# Patient Record
Sex: Male | Born: 1945 | Race: White | Hispanic: No | Marital: Married | Smoking: Never smoker
Health system: Southern US, Community
[De-identification: ages and names within clinical notes are randomized; demographics above are authoritative.]

## PROBLEM LIST (undated history)

## (undated) DIAGNOSIS — C61 Malignant neoplasm of prostate: Secondary | ICD-10-CM

## (undated) DIAGNOSIS — I10 Essential (primary) hypertension: Secondary | ICD-10-CM

## (undated) DIAGNOSIS — E039 Hypothyroidism, unspecified: Secondary | ICD-10-CM

## (undated) DIAGNOSIS — M1990 Unspecified osteoarthritis, unspecified site: Secondary | ICD-10-CM

## (undated) HISTORY — PX: HERNIA REPAIR: SHX51

## (undated) HISTORY — PX: PROSTATE BIOPSY: SHX241

## (undated) HISTORY — PX: THYROID SURGERY: SHX805

## (undated) HISTORY — PX: CARPAL TUNNEL RELEASE: SHX101

---

## 1976-01-13 HISTORY — PX: ANTERIOR CRUCIATE LIGAMENT (ACL) REVISION: SHX6707

## 1979-01-13 HISTORY — PX: ANTERIOR CRUCIATE LIGAMENT (ACL) REVISION: SHX6707

## 1985-01-12 HISTORY — PX: HYDROCELE EXCISION: SHX482

## 1985-01-12 HISTORY — PX: PARATHYROIDECTOMY: SHX19

## 2000-01-13 HISTORY — PX: VENTRAL HERNIA REPAIR: SHX424

## 2006-01-12 HISTORY — PX: CATARACT EXTRACTION, BILATERAL: SHX1313

## 2007-01-13 HISTORY — PX: TOTAL KNEE ARTHROPLASTY: SHX125

## 2012-01-13 HISTORY — PX: COLONOSCOPY: SHX174

## 2012-12-13 DIAGNOSIS — I10 Essential (primary) hypertension: Secondary | ICD-10-CM | POA: Diagnosis not present

## 2012-12-13 DIAGNOSIS — E785 Hyperlipidemia, unspecified: Secondary | ICD-10-CM | POA: Diagnosis not present

## 2012-12-13 DIAGNOSIS — R7301 Impaired fasting glucose: Secondary | ICD-10-CM | POA: Diagnosis not present

## 2012-12-13 DIAGNOSIS — Z6827 Body mass index (BMI) 27.0-27.9, adult: Secondary | ICD-10-CM | POA: Diagnosis not present

## 2012-12-13 DIAGNOSIS — C61 Malignant neoplasm of prostate: Secondary | ICD-10-CM | POA: Diagnosis not present

## 2013-01-18 DIAGNOSIS — H04129 Dry eye syndrome of unspecified lacrimal gland: Secondary | ICD-10-CM | POA: Diagnosis not present

## 2013-05-02 DIAGNOSIS — Z1389 Encounter for screening for other disorder: Secondary | ICD-10-CM | POA: Diagnosis not present

## 2013-05-02 DIAGNOSIS — C61 Malignant neoplasm of prostate: Secondary | ICD-10-CM | POA: Diagnosis not present

## 2013-11-03 DIAGNOSIS — Z23 Encounter for immunization: Secondary | ICD-10-CM | POA: Diagnosis not present

## 2013-11-06 DIAGNOSIS — C61 Malignant neoplasm of prostate: Secondary | ICD-10-CM | POA: Diagnosis not present

## 2013-11-06 DIAGNOSIS — Z8546 Personal history of malignant neoplasm of prostate: Secondary | ICD-10-CM | POA: Diagnosis not present

## 2013-11-06 DIAGNOSIS — Z08 Encounter for follow-up examination after completed treatment for malignant neoplasm: Secondary | ICD-10-CM | POA: Diagnosis not present

## 2013-11-23 DIAGNOSIS — E780 Pure hypercholesterolemia: Secondary | ICD-10-CM | POA: Diagnosis not present

## 2013-11-23 DIAGNOSIS — Z79899 Other long term (current) drug therapy: Secondary | ICD-10-CM | POA: Diagnosis not present

## 2013-11-23 DIAGNOSIS — Z23 Encounter for immunization: Secondary | ICD-10-CM | POA: Diagnosis not present

## 2013-11-23 DIAGNOSIS — R0609 Other forms of dyspnea: Secondary | ICD-10-CM | POA: Diagnosis not present

## 2013-11-23 DIAGNOSIS — Z800 Family history of malignant neoplasm of digestive organs: Secondary | ICD-10-CM | POA: Diagnosis not present

## 2013-11-23 DIAGNOSIS — I10 Essential (primary) hypertension: Secondary | ICD-10-CM | POA: Diagnosis not present

## 2013-11-23 DIAGNOSIS — C61 Malignant neoplasm of prostate: Secondary | ICD-10-CM | POA: Diagnosis not present

## 2013-12-19 DIAGNOSIS — E780 Pure hypercholesterolemia: Secondary | ICD-10-CM | POA: Diagnosis not present

## 2013-12-19 DIAGNOSIS — Z79899 Other long term (current) drug therapy: Secondary | ICD-10-CM | POA: Diagnosis not present

## 2013-12-19 DIAGNOSIS — I10 Essential (primary) hypertension: Secondary | ICD-10-CM | POA: Diagnosis not present

## 2013-12-29 DIAGNOSIS — R739 Hyperglycemia, unspecified: Secondary | ICD-10-CM | POA: Diagnosis not present

## 2014-05-08 DIAGNOSIS — Z08 Encounter for follow-up examination after completed treatment for malignant neoplasm: Secondary | ICD-10-CM | POA: Diagnosis not present

## 2014-05-08 DIAGNOSIS — Z8546 Personal history of malignant neoplasm of prostate: Secondary | ICD-10-CM | POA: Diagnosis not present

## 2014-05-08 DIAGNOSIS — C61 Malignant neoplasm of prostate: Secondary | ICD-10-CM | POA: Diagnosis not present

## 2014-07-18 DIAGNOSIS — Z79899 Other long term (current) drug therapy: Secondary | ICD-10-CM | POA: Diagnosis not present

## 2014-07-18 DIAGNOSIS — E780 Pure hypercholesterolemia: Secondary | ICD-10-CM | POA: Diagnosis not present

## 2014-07-18 DIAGNOSIS — E039 Hypothyroidism, unspecified: Secondary | ICD-10-CM | POA: Diagnosis not present

## 2014-07-18 DIAGNOSIS — I10 Essential (primary) hypertension: Secondary | ICD-10-CM | POA: Diagnosis not present

## 2014-07-18 DIAGNOSIS — Z0000 Encounter for general adult medical examination without abnormal findings: Secondary | ICD-10-CM | POA: Diagnosis not present

## 2014-07-18 DIAGNOSIS — R7309 Other abnormal glucose: Secondary | ICD-10-CM | POA: Diagnosis not present

## 2014-07-18 DIAGNOSIS — C61 Malignant neoplasm of prostate: Secondary | ICD-10-CM | POA: Diagnosis not present

## 2014-08-31 DIAGNOSIS — Z1211 Encounter for screening for malignant neoplasm of colon: Secondary | ICD-10-CM | POA: Diagnosis not present

## 2014-10-14 DIAGNOSIS — Z23 Encounter for immunization: Secondary | ICD-10-CM | POA: Diagnosis not present

## 2014-11-13 DIAGNOSIS — C61 Malignant neoplasm of prostate: Secondary | ICD-10-CM | POA: Diagnosis not present

## 2014-11-13 DIAGNOSIS — Z08 Encounter for follow-up examination after completed treatment for malignant neoplasm: Secondary | ICD-10-CM | POA: Diagnosis not present

## 2014-11-13 DIAGNOSIS — Z8546 Personal history of malignant neoplasm of prostate: Secondary | ICD-10-CM | POA: Diagnosis not present

## 2014-11-15 DIAGNOSIS — E785 Hyperlipidemia, unspecified: Secondary | ICD-10-CM | POA: Diagnosis not present

## 2014-11-15 DIAGNOSIS — R7309 Other abnormal glucose: Secondary | ICD-10-CM | POA: Diagnosis not present

## 2015-01-25 DIAGNOSIS — M7702 Medial epicondylitis, left elbow: Secondary | ICD-10-CM | POA: Diagnosis not present

## 2015-01-25 DIAGNOSIS — G5600 Carpal tunnel syndrome, unspecified upper limb: Secondary | ICD-10-CM | POA: Diagnosis not present

## 2015-01-25 DIAGNOSIS — L723 Sebaceous cyst: Secondary | ICD-10-CM | POA: Diagnosis not present

## 2015-02-01 DIAGNOSIS — Z23 Encounter for immunization: Secondary | ICD-10-CM | POA: Diagnosis not present

## 2015-02-28 ENCOUNTER — Other Ambulatory Visit: Payer: Self-pay | Admitting: Family Medicine

## 2015-02-28 DIAGNOSIS — R109 Unspecified abdominal pain: Secondary | ICD-10-CM | POA: Diagnosis not present

## 2015-02-28 DIAGNOSIS — R1084 Generalized abdominal pain: Secondary | ICD-10-CM

## 2015-02-28 DIAGNOSIS — L720 Epidermal cyst: Secondary | ICD-10-CM | POA: Diagnosis not present

## 2015-03-04 DIAGNOSIS — G5603 Carpal tunnel syndrome, bilateral upper limbs: Secondary | ICD-10-CM | POA: Diagnosis not present

## 2015-03-04 DIAGNOSIS — M542 Cervicalgia: Secondary | ICD-10-CM | POA: Diagnosis not present

## 2015-03-07 DIAGNOSIS — E871 Hypo-osmolality and hyponatremia: Secondary | ICD-10-CM | POA: Diagnosis not present

## 2015-03-08 ENCOUNTER — Ambulatory Visit
Admission: RE | Admit: 2015-03-08 | Discharge: 2015-03-08 | Disposition: A | Discharge status: Home / Self Care | Payer: Medicare Other | Source: Ambulatory Visit | Attending: Family Medicine | Admitting: Family Medicine

## 2015-03-08 DIAGNOSIS — R1084 Generalized abdominal pain: Secondary | ICD-10-CM

## 2015-03-08 DIAGNOSIS — N281 Cyst of kidney, acquired: Secondary | ICD-10-CM | POA: Diagnosis not present

## 2015-03-11 DIAGNOSIS — G5603 Carpal tunnel syndrome, bilateral upper limbs: Secondary | ICD-10-CM | POA: Diagnosis not present

## 2015-03-12 ENCOUNTER — Other Ambulatory Visit: Payer: Self-pay | Admitting: Orthopedic Surgery

## 2015-03-13 DIAGNOSIS — Z23 Encounter for immunization: Secondary | ICD-10-CM | POA: Diagnosis not present

## 2015-03-14 ENCOUNTER — Encounter (HOSPITAL_BASED_OUTPATIENT_CLINIC_OR_DEPARTMENT_OTHER): Payer: Self-pay | Admitting: *Deleted

## 2015-03-18 DIAGNOSIS — M542 Cervicalgia: Secondary | ICD-10-CM | POA: Diagnosis not present

## 2015-03-20 DIAGNOSIS — M542 Cervicalgia: Secondary | ICD-10-CM | POA: Diagnosis not present

## 2015-03-21 ENCOUNTER — Ambulatory Visit (HOSPITAL_BASED_OUTPATIENT_CLINIC_OR_DEPARTMENT_OTHER): Admission: RE | Admit: 2015-03-21 | Payer: Medicare Other | Source: Ambulatory Visit | Admitting: Orthopedic Surgery

## 2015-03-21 ENCOUNTER — Encounter (HOSPITAL_BASED_OUTPATIENT_CLINIC_OR_DEPARTMENT_OTHER): Admission: RE | Payer: Self-pay | Source: Ambulatory Visit

## 2015-03-21 HISTORY — DX: Hypothyroidism, unspecified: E03.9

## 2015-03-21 HISTORY — DX: Essential (primary) hypertension: I10

## 2015-03-21 HISTORY — DX: Malignant neoplasm of prostate: C61

## 2015-03-21 SURGERY — CARPAL TUNNEL RELEASE
Anesthesia: Choice | Laterality: Right

## 2015-03-27 DIAGNOSIS — M542 Cervicalgia: Secondary | ICD-10-CM | POA: Diagnosis not present

## 2015-04-03 DIAGNOSIS — M542 Cervicalgia: Secondary | ICD-10-CM | POA: Diagnosis not present

## 2015-04-03 DIAGNOSIS — E871 Hypo-osmolality and hyponatremia: Secondary | ICD-10-CM | POA: Diagnosis not present

## 2015-04-10 DIAGNOSIS — M542 Cervicalgia: Secondary | ICD-10-CM | POA: Diagnosis not present

## 2015-05-14 DIAGNOSIS — Z08 Encounter for follow-up examination after completed treatment for malignant neoplasm: Secondary | ICD-10-CM | POA: Diagnosis not present

## 2015-05-14 DIAGNOSIS — Z8546 Personal history of malignant neoplasm of prostate: Secondary | ICD-10-CM | POA: Diagnosis not present

## 2015-05-14 DIAGNOSIS — C61 Malignant neoplasm of prostate: Secondary | ICD-10-CM | POA: Diagnosis not present

## 2015-09-30 DIAGNOSIS — Z23 Encounter for immunization: Secondary | ICD-10-CM | POA: Diagnosis not present

## 2015-10-06 DIAGNOSIS — J309 Allergic rhinitis, unspecified: Secondary | ICD-10-CM | POA: Diagnosis not present

## 2015-10-09 DIAGNOSIS — E7800 Pure hypercholesterolemia, unspecified: Secondary | ICD-10-CM | POA: Diagnosis not present

## 2015-10-09 DIAGNOSIS — R7303 Prediabetes: Secondary | ICD-10-CM | POA: Diagnosis not present

## 2015-10-09 DIAGNOSIS — I10 Essential (primary) hypertension: Secondary | ICD-10-CM | POA: Diagnosis not present

## 2015-10-09 DIAGNOSIS — Z0000 Encounter for general adult medical examination without abnormal findings: Secondary | ICD-10-CM | POA: Diagnosis not present

## 2015-10-09 DIAGNOSIS — E039 Hypothyroidism, unspecified: Secondary | ICD-10-CM | POA: Diagnosis not present

## 2015-10-09 DIAGNOSIS — Z79899 Other long term (current) drug therapy: Secondary | ICD-10-CM | POA: Diagnosis not present

## 2015-10-09 DIAGNOSIS — R05 Cough: Secondary | ICD-10-CM | POA: Diagnosis not present

## 2015-10-09 DIAGNOSIS — Z1159 Encounter for screening for other viral diseases: Secondary | ICD-10-CM | POA: Diagnosis not present

## 2015-11-07 DIAGNOSIS — I10 Essential (primary) hypertension: Secondary | ICD-10-CM | POA: Diagnosis not present

## 2015-11-12 DIAGNOSIS — Z125 Encounter for screening for malignant neoplasm of prostate: Secondary | ICD-10-CM | POA: Diagnosis not present

## 2015-11-12 DIAGNOSIS — Z08 Encounter for follow-up examination after completed treatment for malignant neoplasm: Secondary | ICD-10-CM | POA: Diagnosis not present

## 2015-11-12 DIAGNOSIS — C61 Malignant neoplasm of prostate: Secondary | ICD-10-CM | POA: Diagnosis not present

## 2015-11-12 DIAGNOSIS — Z8546 Personal history of malignant neoplasm of prostate: Secondary | ICD-10-CM | POA: Diagnosis not present

## 2016-03-26 DIAGNOSIS — J301 Allergic rhinitis due to pollen: Secondary | ICD-10-CM | POA: Diagnosis not present

## 2016-03-26 DIAGNOSIS — J3089 Other allergic rhinitis: Secondary | ICD-10-CM | POA: Diagnosis not present

## 2016-04-30 DIAGNOSIS — C61 Malignant neoplasm of prostate: Secondary | ICD-10-CM | POA: Diagnosis not present

## 2016-04-30 DIAGNOSIS — Z8546 Personal history of malignant neoplasm of prostate: Secondary | ICD-10-CM | POA: Diagnosis not present

## 2016-04-30 DIAGNOSIS — Z08 Encounter for follow-up examination after completed treatment for malignant neoplasm: Secondary | ICD-10-CM | POA: Diagnosis not present

## 2016-09-01 DIAGNOSIS — G5601 Carpal tunnel syndrome, right upper limb: Secondary | ICD-10-CM | POA: Diagnosis not present

## 2016-10-02 DIAGNOSIS — G5601 Carpal tunnel syndrome, right upper limb: Secondary | ICD-10-CM | POA: Diagnosis not present

## 2016-10-08 DIAGNOSIS — M1990 Unspecified osteoarthritis, unspecified site: Secondary | ICD-10-CM | POA: Diagnosis not present

## 2016-10-08 DIAGNOSIS — Z23 Encounter for immunization: Secondary | ICD-10-CM | POA: Diagnosis not present

## 2016-10-12 HISTORY — PX: CARPAL TUNNEL RELEASE: SHX101

## 2016-10-15 DIAGNOSIS — G5601 Carpal tunnel syndrome, right upper limb: Secondary | ICD-10-CM | POA: Diagnosis not present

## 2016-10-22 DIAGNOSIS — Z79899 Other long term (current) drug therapy: Secondary | ICD-10-CM | POA: Diagnosis not present

## 2016-10-22 DIAGNOSIS — E039 Hypothyroidism, unspecified: Secondary | ICD-10-CM | POA: Diagnosis not present

## 2016-10-22 DIAGNOSIS — Z0000 Encounter for general adult medical examination without abnormal findings: Secondary | ICD-10-CM | POA: Diagnosis not present

## 2016-10-22 DIAGNOSIS — R7303 Prediabetes: Secondary | ICD-10-CM | POA: Diagnosis not present

## 2016-10-22 DIAGNOSIS — Z6827 Body mass index (BMI) 27.0-27.9, adult: Secondary | ICD-10-CM | POA: Diagnosis not present

## 2016-10-22 DIAGNOSIS — C61 Malignant neoplasm of prostate: Secondary | ICD-10-CM | POA: Diagnosis not present

## 2016-10-22 DIAGNOSIS — I10 Essential (primary) hypertension: Secondary | ICD-10-CM | POA: Diagnosis not present

## 2016-10-22 DIAGNOSIS — Z8546 Personal history of malignant neoplasm of prostate: Secondary | ICD-10-CM | POA: Diagnosis not present

## 2016-11-10 DIAGNOSIS — M19041 Primary osteoarthritis, right hand: Secondary | ICD-10-CM | POA: Diagnosis not present

## 2016-11-16 DIAGNOSIS — J301 Allergic rhinitis due to pollen: Secondary | ICD-10-CM | POA: Diagnosis not present

## 2016-11-16 DIAGNOSIS — J3089 Other allergic rhinitis: Secondary | ICD-10-CM | POA: Diagnosis not present

## 2016-12-10 DIAGNOSIS — M19041 Primary osteoarthritis, right hand: Secondary | ICD-10-CM | POA: Diagnosis not present

## 2016-12-14 ENCOUNTER — Other Ambulatory Visit: Payer: Self-pay | Admitting: Orthopedic Surgery

## 2016-12-15 ENCOUNTER — Encounter (HOSPITAL_BASED_OUTPATIENT_CLINIC_OR_DEPARTMENT_OTHER): Payer: Self-pay | Admitting: *Deleted

## 2016-12-15 ENCOUNTER — Other Ambulatory Visit: Payer: Self-pay

## 2016-12-16 ENCOUNTER — Encounter (HOSPITAL_BASED_OUTPATIENT_CLINIC_OR_DEPARTMENT_OTHER)
Admission: RE | Admit: 2016-12-16 | Discharge: 2016-12-16 | Disposition: A | Discharge status: Home / Self Care | Payer: Medicare Other | Source: Ambulatory Visit | Attending: Orthopedic Surgery | Admitting: Orthopedic Surgery

## 2016-12-16 DIAGNOSIS — R001 Bradycardia, unspecified: Secondary | ICD-10-CM | POA: Insufficient documentation

## 2016-12-16 DIAGNOSIS — I10 Essential (primary) hypertension: Secondary | ICD-10-CM | POA: Diagnosis not present

## 2016-12-16 DIAGNOSIS — I4510 Unspecified right bundle-branch block: Secondary | ICD-10-CM | POA: Insufficient documentation

## 2016-12-16 NOTE — Progress Notes (Signed)
EKG reviewed by Dr. Carignan, will proceed with surgery as scheduled. 

## 2016-12-16 NOTE — H&P (Signed)
Connor Meadows is an 71 y.o. male.   CC / Reason for Visit: Right small finger pain and deformity HPI: This patient returns today for reevaluation, indicating that on 11-10-16, the injection to his right small finger PIP joint provided some modest help.  He continues however to have pain on a daily basis, sometimes quite pronounced and affecting his capacity for activities  HPI 11-10-16: This patient presents today for evaluation primarily of his right small finger.  He reports a chronic history of multiple injury events to it, that his left it increasingly painful the PIP joint, also with significant ulnar deviation deformity.  Past Medical History:  Diagnosis Date  . Hypertension   . Hypothyroidism   . Prostate cancer ([place])       No family history on file. Social History:  reports that  has never smoked. He does not have any smokeless tobacco history on file. His alcohol and drug histories are not on file.  Allergies:  Allergies  Allergen Reactions  . Neomycin-Polymyxin B Gu Hives    No medications prior to admission.    No results found for this or any previous visit (from the past 48 hour(s)). No results found.  Review of Systems  All other systems reviewed and are negative.   Height 5' 10.5" (1.791 m), weight 83.9 kg (185 lb). Physical Exam  Constitutional:  WD, WN, NAD HEENT:  NCAT, EOMI Neuro/Psych:  Alert & oriented to person, place, and time; appropriate mood & affect Lymphatic: No generalized UE edema or lymphadenopathy Extremities / MSK:  Both UE are normal with respect to appearance, ranges of motion, joint stability, muscle strength/tone, sensation, & perfusion except as otherwise noted:  The right small finger has an enlarged PIP joint, which rests ulnarly angulated a good 15-20, with only a range of 20-50.  The PIP joint is tender to palpation, and there some increased pain with passive movement, particularly at end ranges.  Labs / Xrays:  No  radiographic studies obtained today.  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  We discussed surgical options such as arthrodesis and what that would entail, as well as the spectrum of nonoperative treatment.  At this point, he is developed progressive symptoms to the point that he is ready to proceed operatively with arthrodesis.  He understands that there will be no further motion at the PIP joint and all the motion for the digit will calm at the MP and DIP joints.  In addition, we discussed postoperative therapy/immobilization, etc.  However, in the course of the arthrodesis we can improve upon the present ulnar angulation.  He would like to proceed before the end of the year.  We will make the appropriate planning and proceed at a time course that suits him best.  The details of the operative procedure were discussed with the patient.  Questions were invited and answered.  In addition to the goal of the procedure, the risks of the procedure to include but not limited to bleeding; infection; damage to the nerves or blood vessels that could result in bleeding, numbness, weakness, chronic pain, and the need for additional procedures; stiffness; the need for revision surgery; and anesthetic risks were reviewed.  No specific outcome was guaranteed or implied.  Informed consent was obtained.  Jolyn Nap, MD 12/16/2016, 7:21 PM

## 2016-12-22 ENCOUNTER — Ambulatory Visit (HOSPITAL_BASED_OUTPATIENT_CLINIC_OR_DEPARTMENT_OTHER)
Admission: RE | Admit: 2016-12-22 | Discharge: 2016-12-22 | Disposition: A | Discharge status: Home / Self Care | Payer: Medicare Other | Source: Ambulatory Visit | Attending: Orthopedic Surgery | Admitting: Orthopedic Surgery

## 2016-12-22 ENCOUNTER — Ambulatory Visit (HOSPITAL_BASED_OUTPATIENT_CLINIC_OR_DEPARTMENT_OTHER): Payer: Medicare Other | Admitting: Anesthesiology

## 2016-12-22 ENCOUNTER — Ambulatory Visit (HOSPITAL_COMMUNITY): Payer: Medicare Other

## 2016-12-22 ENCOUNTER — Encounter (HOSPITAL_BASED_OUTPATIENT_CLINIC_OR_DEPARTMENT_OTHER)
Admission: RE | Disposition: A | Discharge status: Home / Self Care | Payer: Self-pay | Source: Ambulatory Visit | Attending: Orthopedic Surgery

## 2016-12-22 ENCOUNTER — Encounter (HOSPITAL_BASED_OUTPATIENT_CLINIC_OR_DEPARTMENT_OTHER): Payer: Self-pay | Admitting: *Deleted

## 2016-12-22 DIAGNOSIS — M20001 Unspecified deformity of right finger(s): Secondary | ICD-10-CM | POA: Insufficient documentation

## 2016-12-22 DIAGNOSIS — M25741 Osteophyte, right hand: Secondary | ICD-10-CM | POA: Diagnosis not present

## 2016-12-22 DIAGNOSIS — M19041 Primary osteoarthritis, right hand: Secondary | ICD-10-CM | POA: Insufficient documentation

## 2016-12-22 DIAGNOSIS — Z981 Arthrodesis status: Secondary | ICD-10-CM | POA: Diagnosis not present

## 2016-12-22 DIAGNOSIS — I10 Essential (primary) hypertension: Secondary | ICD-10-CM | POA: Diagnosis not present

## 2016-12-22 DIAGNOSIS — Z8546 Personal history of malignant neoplasm of prostate: Secondary | ICD-10-CM | POA: Diagnosis not present

## 2016-12-22 DIAGNOSIS — M1990 Unspecified osteoarthritis, unspecified site: Secondary | ICD-10-CM

## 2016-12-22 DIAGNOSIS — M25541 Pain in joints of right hand: Secondary | ICD-10-CM | POA: Diagnosis present

## 2016-12-22 HISTORY — DX: Unspecified osteoarthritis, unspecified site: M19.90

## 2016-12-22 HISTORY — PX: FINGER ARTHRODESIS: SHX5000

## 2016-12-22 SURGERY — FUSION, JOINT, FINGER
Anesthesia: General | Site: Little Finger | Laterality: Right

## 2016-12-22 MED ORDER — PROPOFOL 10 MG/ML IV BOLUS
INTRAVENOUS | Status: DC | PRN
  Administered 2016-12-22: 150 mg via INTRAVENOUS
  Administered 2016-12-22: 50 mg via INTRAVENOUS

## 2016-12-22 MED ORDER — PROMETHAZINE HCL 25 MG/ML IJ SOLN: 6.2500 mg | INTRAMUSCULAR | Status: DC | PRN

## 2016-12-22 MED ORDER — OXYCODONE HCL 5 MG PO TABS: 5.0000 mg | ORAL_TABLET | Freq: Once | ORAL | Status: DC | PRN

## 2016-12-22 MED ORDER — FENTANYL CITRATE (PF) 100 MCG/2ML IJ SOLN: 50.0000 ug | INTRAMUSCULAR | Status: DC | PRN

## 2016-12-22 MED ORDER — EPHEDRINE SULFATE-NACL 50-0.9 MG/10ML-% IV SOSY
PREFILLED_SYRINGE | INTRAVENOUS | Status: DC | PRN
  Administered 2016-12-22 (×2): 10 mg via INTRAVENOUS

## 2016-12-22 MED ORDER — DEXAMETHASONE SODIUM PHOSPHATE 10 MG/ML IJ SOLN
INTRAMUSCULAR | Status: DC | PRN
  Administered 2016-12-22: 10 mg via INTRAVENOUS

## 2016-12-22 MED ORDER — OXYCODONE HCL 5 MG/5ML PO SOLN: 5.0000 mg | Freq: Once | ORAL | Status: DC | PRN

## 2016-12-22 MED ORDER — OXYCODONE HCL 5 MG PO TABS: 5.0000 mg | ORAL_TABLET | Freq: Four times a day (QID) | ORAL | 0 refills | Status: AC | PRN

## 2016-12-22 MED ORDER — BUPIVACAINE HCL (PF) 0.25 % IJ SOLN
INTRAMUSCULAR | Status: AC
  Filled 2016-12-22: qty 30

## 2016-12-22 MED ORDER — ONDANSETRON HCL 4 MG/2ML IJ SOLN
INTRAMUSCULAR | Status: DC | PRN
  Administered 2016-12-22: 4 mg via INTRAVENOUS

## 2016-12-22 MED ORDER — DEXAMETHASONE SODIUM PHOSPHATE 10 MG/ML IJ SOLN
INTRAMUSCULAR | Status: AC
  Filled 2016-12-22: qty 1

## 2016-12-22 MED ORDER — LIDOCAINE 2% (20 MG/ML) 5 ML SYRINGE
INTRAMUSCULAR | Status: DC | PRN
  Administered 2016-12-22: 60 mg via INTRAVENOUS

## 2016-12-22 MED ORDER — LIDOCAINE 2% (20 MG/ML) 5 ML SYRINGE
INTRAMUSCULAR | Status: AC
  Filled 2016-12-22: qty 5

## 2016-12-22 MED ORDER — BUPIVACAINE HCL (PF) 0.25 % IJ SOLN
INTRAMUSCULAR | Status: DC | PRN
  Administered 2016-12-22: 10 mL

## 2016-12-22 MED ORDER — LACTATED RINGERS IV SOLN: INTRAVENOUS | Status: DC

## 2016-12-22 MED ORDER — ONDANSETRON HCL 4 MG/2ML IJ SOLN
INTRAMUSCULAR | Status: AC
  Filled 2016-12-22: qty 2

## 2016-12-22 MED ORDER — MEPERIDINE HCL 25 MG/ML IJ SOLN: 6.2500 mg | INTRAMUSCULAR | Status: DC | PRN

## 2016-12-22 MED ORDER — EPHEDRINE 5 MG/ML INJ
INTRAVENOUS | Status: AC
  Filled 2016-12-22: qty 10

## 2016-12-22 MED ORDER — FENTANYL CITRATE (PF) 100 MCG/2ML IJ SOLN
INTRAMUSCULAR | Status: AC
  Filled 2016-12-22: qty 2

## 2016-12-22 MED ORDER — HYDROMORPHONE HCL 1 MG/ML IJ SOLN: 0.2500 mg | INTRAMUSCULAR | Status: DC | PRN

## 2016-12-22 MED ORDER — MIDAZOLAM HCL 2 MG/2ML IJ SOLN
INTRAMUSCULAR | Status: AC
  Filled 2016-12-22: qty 2

## 2016-12-22 MED ORDER — LACTATED RINGERS IV SOLN
INTRAVENOUS | Status: DC
  Administered 2016-12-22 (×2): via INTRAVENOUS

## 2016-12-22 MED ORDER — FENTANYL CITRATE (PF) 100 MCG/2ML IJ SOLN
INTRAMUSCULAR | Status: DC | PRN
  Administered 2016-12-22: 50 ug via INTRAVENOUS
  Administered 2016-12-22 (×2): 25 ug via INTRAVENOUS

## 2016-12-22 MED ORDER — CEFAZOLIN SODIUM-DEXTROSE 2-4 GM/100ML-% IV SOLN
2.0000 g | INTRAVENOUS | Status: AC
  Administered 2016-12-22: 2 g via INTRAVENOUS

## 2016-12-22 MED ORDER — SCOPOLAMINE 1 MG/3DAYS TD PT72: 1.0000 | MEDICATED_PATCH | Freq: Once | TRANSDERMAL | Status: DC | PRN

## 2016-12-22 MED ORDER — MIDAZOLAM HCL 2 MG/2ML IJ SOLN: 1.0000 mg | INTRAMUSCULAR | Status: DC | PRN

## 2016-12-22 MED ORDER — MIDAZOLAM HCL 5 MG/5ML IJ SOLN
INTRAMUSCULAR | Status: DC | PRN
  Administered 2016-12-22: 2 mg via INTRAVENOUS

## 2016-12-22 MED ORDER — BACITRACIN ZINC 500 UNIT/GM EX OINT
TOPICAL_OINTMENT | CUTANEOUS | Status: AC
  Filled 2016-12-22: qty 0.9

## 2016-12-22 MED ORDER — ACETAMINOPHEN 325 MG PO TABS: 650.0000 mg | ORAL_TABLET | Freq: Four times a day (QID) | ORAL | Status: AC | PRN

## 2016-12-22 SURGICAL SUPPLY — 59 items
10MM DISTAL REAMER ×3 IMPLANT
BLADE AVERAGE 25MMX9MM (BLADE)
BLADE AVERAGE 25X9 (BLADE) IMPLANT
BLADE MINI RND TIP GREEN BEAV (BLADE) IMPLANT
BLADE SURG 15 STRL LF DISP TIS (BLADE) ×1 IMPLANT
BLADE SURG 15 STRL SS (BLADE) ×2
BNDG COHESIVE 1X5 TAN STRL LF (GAUZE/BANDAGES/DRESSINGS) IMPLANT
BNDG COHESIVE 4X5 TAN STRL (GAUZE/BANDAGES/DRESSINGS) ×6 IMPLANT
BNDG CONFORM 2 STRL LF (GAUZE/BANDAGES/DRESSINGS) IMPLANT
BNDG ESMARK 4X9 LF (GAUZE/BANDAGES/DRESSINGS) IMPLANT
BNDG GAUZE ELAST 4 BULKY (GAUZE/BANDAGES/DRESSINGS) ×3 IMPLANT
CHLORAPREP W/TINT 26ML (MISCELLANEOUS) ×3 IMPLANT
CORD BIPOLAR FORCEPS 12FT (ELECTRODE) ×3 IMPLANT
COVER BACK TABLE 60X90IN (DRAPES) ×3 IMPLANT
COVER MAYO STAND STRL (DRAPES) ×3 IMPLANT
CUFF TOURNIQUET SINGLE 18IN (TOURNIQUET CUFF) ×3 IMPLANT
DRAPE C-ARM 42X72 X-RAY (DRAPES) ×3 IMPLANT
DRAPE EXTREMITY T 121X128X90 (DRAPE) ×3 IMPLANT
DRAPE SURG 17X23 STRL (DRAPES) ×3 IMPLANT
DRSG EMULSION OIL 3X3 NADH (GAUZE/BANDAGES/DRESSINGS) ×3 IMPLANT
ELECT REM PT RETURN 9FT ADLT (ELECTROSURGICAL)
ELECTRODE REM PT RTRN 9FT ADLT (ELECTROSURGICAL) IMPLANT
GAUZE SPONGE 4X4 12PLY STRL LF (GAUZE/BANDAGES/DRESSINGS) ×3 IMPLANT
GLOVE BIO SURGEON STRL SZ7.5 (GLOVE) ×3 IMPLANT
GLOVE BIOGEL PI IND STRL 7.0 (GLOVE) ×1 IMPLANT
GLOVE BIOGEL PI IND STRL 8 (GLOVE) ×1 IMPLANT
GLOVE BIOGEL PI INDICATOR 7.0 (GLOVE) ×2
GLOVE BIOGEL PI INDICATOR 8 (GLOVE) ×2
GLOVE ECLIPSE 6.5 STRL STRAW (GLOVE) ×3 IMPLANT
GOWN STRL REUS W/ TWL LRG LVL3 (GOWN DISPOSABLE) ×2 IMPLANT
GOWN STRL REUS W/TWL LRG LVL3 (GOWN DISPOSABLE) ×4
GOWN STRL REUS W/TWL XL LVL3 (GOWN DISPOSABLE) ×3 IMPLANT
GUIDEWIRE (WIRE) ×4
GUIDEWIRE 0.9MM (WIRE) ×2 IMPLANT
LOOP VESSEL MINI RED (MISCELLANEOUS) IMPLANT
MED PIP POST 45 DEGREE ×3 IMPLANT
NEEDLE HYPO 22GX1.5 SAFETY (NEEDLE) ×3 IMPLANT
NS IRRIG 1000ML POUR BTL (IV SOLUTION) ×3 IMPLANT
PACK BASIN DAY SURGERY FS (CUSTOM PROCEDURE TRAY) ×3 IMPLANT
PENCIL BUTTON HOLSTER BLD 10FT (ELECTRODE) IMPLANT
RASP PROX 10MM (DRILL) ×3 IMPLANT
REAMER XPOST (DRILL) ×3 IMPLANT
SCREW LAG 18MMX3.0 (Screw) ×3 IMPLANT
SPLINT PLASTER CAST XFAST 3X15 (CAST SUPPLIES) ×1 IMPLANT
SPLINT PLASTER XTRA FASTSET 3X (CAST SUPPLIES) ×2
STOCKINETTE 6  STRL (DRAPES) ×2
STOCKINETTE 6 STRL (DRAPES) ×1 IMPLANT
SUT STEEL 2 (SUTURE) IMPLANT
SUT STEEL 4 (SUTURE) IMPLANT
SUT STEEL 5 (SUTURE) IMPLANT
SUT VICRYL 4-0 PS2 18IN ABS (SUTURE) ×3 IMPLANT
SUT VICRYL RAPIDE 4-0 (SUTURE) IMPLANT
SUT VICRYL RAPIDE 4/0 PS 2 (SUTURE) IMPLANT
SUT VICRYL+ 3-0 27IN RB-1 (SUTURE) ×3 IMPLANT
SYR 10ML LL (SYRINGE) IMPLANT
SYR BULB 3OZ (MISCELLANEOUS) ×3 IMPLANT
TOWEL OR 17X24 6PK STRL BLUE (TOWEL DISPOSABLE) ×3 IMPLANT
TOWEL OR NON WOVEN STRL DISP B (DISPOSABLE) IMPLANT
UNDERPAD 30X30 (UNDERPADS AND DIAPERS) ×3 IMPLANT

## 2016-12-22 NOTE — Anesthesia Postprocedure Evaluation (Signed)
Anesthesia Post Note  Patient: Connor Meadows  Procedure(s) Performed: RIGHT SMALL FINGER ARTHRODESIS (Right Little Finger)     Patient location during evaluation: PACU Anesthesia Type: General Level of consciousness: awake and alert Pain management: pain level controlled Vital Signs Assessment: post-procedure vital signs reviewed and stable Respiratory status: spontaneous breathing, nonlabored ventilation, respiratory function stable and patient connected to nasal cannula oxygen Cardiovascular status: blood pressure returned to baseline and stable Postop Assessment: no apparent nausea or vomiting Anesthetic complications: no    Last Vitals:  Vitals:   12/22/16 1515 12/22/16 1531  BP: (!) 168/86 (!) 177/83  Pulse: (!) 54 (!) 58  Resp: 17 18  Temp:  36.5 C  SpO2: 94% 95%    Last Pain:  Vitals:   12/22/16 1201  TempSrc: Oral                 Effie Berkshire

## 2016-12-22 NOTE — Discharge Instructions (Signed)
°  Post Anesthesia Home Care Instructions  Activity: Get plenty of rest for the remainder of the day. A responsible individual must stay with you for 24 hours following the procedure.  For the next 24 hours, DO NOT: -Drive a car -Paediatric nurse -Drink alcoholic beverages -Take any medication unless instructed by your physician -Make any legal decisions or sign important papers.  Meals: Start with liquid foods such as gelatin or soup. Progress to regular foods as tolerated. Avoid greasy, spicy, heavy foods. If nausea and/or vomiting occur, drink only clear liquids until the nausea and/or vomiting subsides. Call your physician if vomiting continues.  Special Instructions/Symptoms: Your throat may feel dry or sore from the anesthesia or the breathing tube placed in your throat during surgery. If this causes discomfort, gargle with warm salt water. The discomfort should disappear within 24 hours.  If you had a scopolamine patch placed behind your ear for the management of post- operative nausea and/or vomiting:  1. The medication in the patch is effective for 72 hours, after which it should be removed.  Wrap patch in a tissue and discard in the trash. Wash hands thoroughly with soap and water. 2. You may remove the patch earlier than 72 hours if you experience unpleasant side effects which may include dry mouth, dizziness or visual disturbances. 3. Avoid touching the patch. Wash your hands with soap and water after contact with the patch.    Discharge Instructions   You have a dressing with a plaster splint incorporated in it. Move your fingers as much as possible, making a full fist and fully opening the fist. Elevate your hand to reduce pain & swelling of the digits.  Ice over the operative site may be helpful to reduce pain & swelling.  DO NOT USE HEAT. Pain medicine has been prescribed for you. Use Tylenol every 6 hours, 650 mg over the counter. You may use the pain medicine  prescribed for you in addition to the Tylenol as a rescue medicine. Leave the dressing in place until you return to our office.  You may shower, but keep the bandage clean & dry.  You may drive a car when you are off of prescription pain medications and can safely control your vehicle with both hands. Our office will call you to arrange follow-up   Please call [phone] during normal business hours or [phone] after hours for any problems. Including the following:  - excessive redness of the incisions - drainage for more than 4 days - fever of more than 101.5 F  *Please note that pain medications will not be refilled after hours or on weekends.

## 2016-12-22 NOTE — Anesthesia Procedure Notes (Signed)
Procedure Name: LMA Insertion Date/Time: 12/22/2016 1:14 PM Performed by: Genelle Bal, CRNA Pre-anesthesia Checklist: Patient identified, Emergency Drugs available, Suction available and Patient being monitored Patient Re-evaluated:Patient Re-evaluated prior to induction Oxygen Delivery Method: Circle system utilized Preoxygenation: Pre-oxygenation with 100% oxygen Induction Type: IV induction Ventilation: Mask ventilation without difficulty LMA: LMA inserted LMA Size: 4.0 Number of attempts: 1 Airway Equipment and Method: Bite block Placement Confirmation: positive ETCO2 Tube secured with: Tape Dental Injury: Teeth and Oropharynx as per pre-operative assessment

## 2016-12-22 NOTE — Op Note (Signed)
12/22/2016  1:00 PM  PATIENT:  Connor Meadows  71 y.o. male  PRE-OPERATIVE DIAGNOSIS:  R SF PIP arthritis with deformity  POST-OPERATIVE DIAGNOSIS:  Same  PROCEDURE: Right small finger PIP arthrodesis with autograft  SURGEON: Rayvon Char. Grandville Silos, MD  PHYSICIAN ASSISTANT: Morley Kos, OPA-C  ANESTHESIA:  general  SPECIMENS:  None  DRAINS:   None  EBL:  less than 50 mL  PREOPERATIVE INDICATIONS:  [name] is a  71 y.o. male with right small finger PIP arthritis with significant ulnar angulation  The risks benefits and alternatives were discussed with the patient preoperatively including but not limited to the risks of infection, bleeding, nerve injury, cardiopulmonary complications, the need for revision surgery, among others, and the patient verbalized understanding and consented to proceed.  OPERATIVE IMPLANTS: Extremity Medical APEX IP fusion device, medium proximally, 45 degrees, 48mm distal implant  OPERATIVE PROCEDURE:  After receiving prophylactic antibiotics, the patient was escorted to the operative theatre and placed in a supine position.  General anesthesia was administered. A surgical "time-out" was performed during which the planned procedure, proposed operative site, and the correct patient identity were compared to the operative consent and agreement confirmed by the circulating nurse according to current facility policy.  Following application of a tourniquet to the operative extremity, the exposed skin was prepped with Chloraprep and draped in the usual sterile fashion.  The limb was exsanguinated with an Esmarch bandage and the tourniquet inflated to approximately 18mmHg higher than systolic BP.  Half percent plain Marcaine was instilled at the base of the digit to assist with postoperative pain control.  A 2 limb zigzag chevron incision was made over the dorsal aspect of the small finger.  This was done largely to be able to perform a V-Y advancement  if needed due to possible anticipated tension of the skin on the ulnar side due to the long-standing ulnar angulation.  Full-thickness flap was elevated and the extensor mechanism was split centrally exposing the underlying capsule.  There were large osteophytes of the proximal phalanx.  These were excised.  The collateral ligaments were released from the head of the proximal phalanx and the joint could be shotgunned in this fashion.  Osteophytes were removed.  Attention was then directed to the preparation of the proximal phalanx.  A guidewire was first placed under fluoroscopic guidance, followed by the tapered reamer and the medium implant.  Once it was positioned, a rongeur and the planar was used to help further prepare the head of the proximal phalanx.  There was noted to be very slight bone deficiency ulnarly due to the ulnar angulation, and ultimately some bone graft was placed there to help augment it.  The osteophytes that had been removed were used, as well as some of the bone and the preparation phase, and some reamings.  Once the proximal implant was placed, the base of the middle phalanx was further exposed and a guidewire placed.  The planar was used to help prepare the base of the middle phalanx.  The alignment of the fusion was adjusted slightly and then some of the mushed up bone was placed on the ulnar side before the fusion was compressed with the advancement of the distal implant.  There was nice approximation, solid fixation, and final images were obtained.  The rondure was used to further taper the bulbous enlargement at the fusion site, specifically removing portions of the head of the proximal phalanx and base of the middle phalanx that did  not have corresponding coverage across the fusion site.  This was then irrigated, and the extensor apparatus reapproximated with 4-0 Vicryl interrupted suture.  The tourniquet was released, some additional hemostasis was obtained and the skin  reapproximated with 4-0 Vicryl Rapide interrupted suture.  A splint dressing was applied that encompassed the ring and small fingers, leaving the thumb, index, and long fingers free.  He was awakened and taken to the recovery room in stable condition, breathing spontaneously.  DISPOSITION: He will be discharged home today with typical instructions, returning in 7-10 days.  At that time he showed me x-rays of the right small finger out of the splint and transition to hand therapy to have a custom splint fabricated and begin rehab.

## 2016-12-22 NOTE — Anesthesia Preprocedure Evaluation (Addendum)
Anesthesia Evaluation  Patient identified by MRN, date of birth, ID band Patient awake    Reviewed: Allergy & Precautions, NPO status , Patient's Chart, lab work & pertinent test results  Airway Mallampati: II  TM Distance: >3 FB Neck ROM: Full    Dental  (+) Teeth Intact, Dental Advisory Given, Caps,    Pulmonary neg pulmonary ROS,    breath sounds clear to auscultation       Cardiovascular hypertension, Pt. on medications and Pt. on home beta blockers  Rhythm:Regular Rate:Normal     Neuro/Psych negative neurological ROS     GI/Hepatic negative GI ROS, Neg liver ROS,   Endo/Other  Hypothyroidism   Renal/GU negative Renal ROS  negative genitourinary   Musculoskeletal negative musculoskeletal ROS (+)   Abdominal Normal abdominal exam  (+)   Peds  Hematology negative hematology ROS (+)   Anesthesia Other Findings   Reproductive/Obstetrics                           EKG: sinus bradycardia, RBBB.  Anesthesia Physical Anesthesia Plan  ASA: II  Anesthesia Plan: General   Post-op Pain Management:    Induction: Intravenous  PONV Risk Score and Plan: 3 and Ondansetron, Dexamethasone and Midazolam  Airway Management Planned: LMA  Additional Equipment:   Intra-op Plan:   Post-operative Plan: Extubation in OR  Informed Consent: I have reviewed the patients History and Physical, chart, labs and discussed the procedure including the risks, benefits and alternatives for the proposed anesthesia with the patient or authorized representative who has indicated his/her understanding and acceptance.   Dental advisory given  Plan Discussed with: CRNA  Anesthesia Plan Comments:         Anesthesia Quick Evaluation

## 2016-12-22 NOTE — Transfer of Care (Signed)
Immediate Anesthesia Transfer of Care Note  Patient: Connor Meadows  Procedure(s) Performed: RIGHT SMALL FINGER ARTHRODESIS (Right Little Finger)  Patient Location: PACU  Anesthesia Type:General  Level of Consciousness: drowsy and patient cooperative  Airway & Oxygen Therapy: Patient Spontanous Breathing and Patient connected to face mask oxygen  Post-op Assessment: Report given to RN and Post -op Vital signs reviewed and stable  Post vital signs: Reviewed and stable  Last Vitals:  Vitals:   12/22/16 1201  BP: (!) 164/81  Pulse: (!) 56  Resp: 16  Temp: 36.4 C  SpO2: 98%    Last Pain:  Vitals:   12/22/16 1201  TempSrc: Oral      Patients Stated Pain Goal: 5 (89/38/10 1751)  Complications: No apparent anesthesia complications

## 2016-12-22 NOTE — Interval H&P Note (Signed)
History and Physical Interval Note:  12/22/2016 12:59 PM  Connor Meadows  has presented today for surgery, with the diagnosis of RIGHT SMALL FINGER ARHTRITIS M19.041  The various methods of treatment have been discussed with the patient and family. After consideration of risks, benefits and other options for treatment, the patient has consented to  Procedure(s): RIGHT SMALL FINGER ARTHRODESIS (Right) as a surgical intervention .  The patient's history has been reviewed, patient examined, no change in status, stable for surgery.  I have reviewed the patient's chart and labs.  Questions were answered to the patient's satisfaction.     Jolyn Nap

## 2016-12-23 ENCOUNTER — Encounter (HOSPITAL_BASED_OUTPATIENT_CLINIC_OR_DEPARTMENT_OTHER): Payer: Self-pay | Admitting: Orthopedic Surgery

## 2016-12-31 DIAGNOSIS — M19041 Primary osteoarthritis, right hand: Secondary | ICD-10-CM | POA: Diagnosis not present

## 2017-01-01 ENCOUNTER — Encounter: Payer: Medicare Other | Attending: Family Medicine | Admitting: Registered"

## 2017-01-01 ENCOUNTER — Encounter: Payer: Self-pay | Admitting: Registered"

## 2017-01-01 DIAGNOSIS — R739 Hyperglycemia, unspecified: Secondary | ICD-10-CM

## 2017-01-01 NOTE — Progress Notes (Signed)
Patient was seen on 01/01/17 for the Core Session 1 of Diabetes Prevention Program course at [facility]. The following learning objectives were met by the patient during this class:   Learning Objectives:   Be able to explain the purpose and benefits of the National Diabetes Prevention Program.   Be able to describe the events that will take place at every session.   Know the weight loss and physical activity goals established by the [facility] Diabetes Prevention Program.   Know their own individual weight loss and physical activity goals.   Be able to explain the important effect of self-monitoring on behavior change.   Goals:  Record food and beverage intake in "Food and Activity Tracker" over the next week.  Bring completed "Food and Activity Tracker" for session 1 to session 2 next week. Circle the foods or beverages you think are highest in fat and calories in your food tracker. Read the labels on the food you buy, and consider using measuring cups and spoons to help you calculate the amount you eat. We will talk about measuring in more detail in the coming weeks.   Follow-Up Plan:  Attend Core Session 2 next week.   Bring completed "Food and Activity Tracker" next week to be reviewed by Lifestyle Coach.

## 2017-01-08 ENCOUNTER — Encounter: Payer: Self-pay | Admitting: Registered"

## 2017-01-08 ENCOUNTER — Encounter: Payer: Medicare Other | Admitting: Registered"

## 2017-01-08 DIAGNOSIS — R739 Hyperglycemia, unspecified: Secondary | ICD-10-CM

## 2017-01-08 NOTE — Progress Notes (Signed)
Patient was seen on 01/08/17 for the Core Session 2 of Diabetes Prevention Program course at [facility]. By the end of this session patients are able to complete the following objectives:   Learning Objectives:  Self-monitor their weight during the weeks following Session 2.   Describe the relationship between fat and calories.   Explain the reason for, and basic principles of, self-monitoring fat grams and calories.   Identify their personal fat gram goals.   Use the Fat and Calorie Counter to calculate the calories and fat grams of a given selection of foods.   Keep a running total of the fat grams they eat each day.   Calculate fat, calories, and serving sizes from nutrition labels.   Goals:   Weigh yourself at the same time each day, or every few days, and record your weight in your Food and Activity Tracker.  Write down everything you eat and drink in your Food and Activity Tracker.  Measure portions as much as you can, and start reading labels.   Use the Fat and Calorie Counter to figure out the amount of fat and calories in what you ate, and write the amount down in your Food and Activity Tracker.  Keep a running fat gram total throughout the day. Come as close to your fat gram goal as you can.   Follow-Up Plan:  Attend Core Session 3 next week.   Bring completed "Food and Activity Tracker" next week to be reviewed by Lifestyle Coach.

## 2017-01-14 DIAGNOSIS — M79644 Pain in right finger(s): Secondary | ICD-10-CM | POA: Diagnosis not present

## 2017-01-14 DIAGNOSIS — M25641 Stiffness of right hand, not elsewhere classified: Secondary | ICD-10-CM | POA: Diagnosis not present

## 2017-01-15 ENCOUNTER — Encounter: Payer: Medicare Other | Admitting: Registered"

## 2017-01-20 ENCOUNTER — Encounter: Payer: Medicare Other | Attending: Family Medicine | Admitting: Registered"

## 2017-01-20 ENCOUNTER — Encounter: Payer: Self-pay | Admitting: Registered"

## 2017-01-20 DIAGNOSIS — R7309 Other abnormal glucose: Secondary | ICD-10-CM

## 2017-01-20 NOTE — Progress Notes (Signed)
On 01/20/17 patient completed Core Session 3 of Diabetes Prevention Program course with [facility]. By the end of this session patients are able to complete the following objectives:   Learning Objectives:  Weigh and measure foods.  Estimate the fat and calorie content of common foods.  Describe three ways to eat less fat and fewer calories.  Create a plan to eat less fat for the following week.   Goals:   Track weight when weighing outside of class.   Track food and beverages eaten each day in Food and Activity Tracker and include fat grams and calories for each.   Try to stay without fat gram goal.   Complete plan for eating less high fat foods and answer related homework questions.    Follow-Up Plan:  Attend Core Session 4 next week.   Bring completed "Food and Activity Tracker" next week to be reviewed by Lifestyle Coach.

## 2017-01-22 ENCOUNTER — Encounter: Payer: Self-pay | Admitting: Registered"

## 2017-01-22 ENCOUNTER — Encounter: Payer: Medicare Other | Admitting: Registered"

## 2017-01-22 DIAGNOSIS — R739 Hyperglycemia, unspecified: Secondary | ICD-10-CM

## 2017-01-22 NOTE — Progress Notes (Signed)
Patient was seen on 01/22/17 for the Core Session 4 of Diabetes Prevention Program course at [facility]. By the end of this session patients are able to complete the following objectives:   Learning Objectives:  Explain the health benefits of eating less fat and fewer calories.  Describe the MyPlate food guide and its recommendations, including  how to reduce fat and calories in our diet.  Compare and contrast MyPlate guidelines with participants' eating  habits.  List ways to replace high-fat and high-calorie foods with low-fat and  low-calorie foods.  Explain the importance of eating plenty of whole grains, vegetables, and  fruits, while staying within fat gram goals.  Explain the importance of eating foods from all groups of MyPlate and  of eating a variety of foods from within each group.  Explain why a balanced diet is beneficial to health.  Explain why eating the same foods over and over is not the best strategy  for long-term success.   Goals:   Record weight taken outside of class.   Track foods and beverages eaten each day in the "Food and Activity Tracker," including calories and fat grams for each item.   Practice comparing what you eat with the recommendations of MyPlate using the "Rate Your Plate" handout.   Complete the "Rate Your Plate" handout form on at least 3 days.   Answer homework questions.   Follow-Up Plan:  Attend Core Session 5 next week.   Bring completed "Food and Activity Tracker" next week to be reviewed by Lifestyle Coach.

## 2017-01-28 DIAGNOSIS — L723 Sebaceous cyst: Secondary | ICD-10-CM | POA: Diagnosis not present

## 2017-01-28 DIAGNOSIS — L814 Other melanin hyperpigmentation: Secondary | ICD-10-CM | POA: Diagnosis not present

## 2017-01-28 DIAGNOSIS — Z23 Encounter for immunization: Secondary | ICD-10-CM | POA: Diagnosis not present

## 2017-01-28 DIAGNOSIS — L821 Other seborrheic keratosis: Secondary | ICD-10-CM | POA: Diagnosis not present

## 2017-01-28 DIAGNOSIS — D1801 Hemangioma of skin and subcutaneous tissue: Secondary | ICD-10-CM | POA: Diagnosis not present

## 2017-01-29 ENCOUNTER — Encounter: Payer: Medicare Other | Admitting: Registered"

## 2017-01-29 ENCOUNTER — Encounter: Payer: Self-pay | Admitting: Registered"

## 2017-01-29 DIAGNOSIS — R739 Hyperglycemia, unspecified: Secondary | ICD-10-CM

## 2017-01-29 NOTE — Progress Notes (Signed)
Patient was seen on 01/29/17 for the Core Session 5 of Diabetes Prevention Program course at [facility]. By the end of this session patients are able to complete the following objectives:   Learning Objectives:  Establish a physical activity goal.  Explain the importance of the physical activity goal.  Describe their current level of physical activity.  Name ways that they are already physically active.  Develop personal plans for physical activity for the next week.   Goals:   Record weight taken outside of class.   Track foods and beverages eaten each day in the "Food and Activity Tracker," including calories and fat grams for each item.   Make an Activity Plan including date, specific type of activity, and length of time you plan to be active that includes at last 60 minutes of activity for the week.   Track activity type, minutes you were active, and distance you reached each day in the "Food and Activity Tracker."   Follow-Up Plan:  Attend Core Session 6 next week.   Bring completed "Food and Activity Tracker" next week to be reviewed by Lifestyle Coach.

## 2017-02-02 DIAGNOSIS — E7800 Pure hypercholesterolemia, unspecified: Secondary | ICD-10-CM | POA: Diagnosis not present

## 2017-02-04 DIAGNOSIS — M19041 Primary osteoarthritis, right hand: Secondary | ICD-10-CM | POA: Diagnosis not present

## 2017-02-05 ENCOUNTER — Encounter: Payer: Self-pay | Admitting: Registered"

## 2017-02-05 ENCOUNTER — Encounter: Payer: Medicare Other | Admitting: Registered"

## 2017-02-05 DIAGNOSIS — R739 Hyperglycemia, unspecified: Secondary | ICD-10-CM

## 2017-02-05 NOTE — Progress Notes (Signed)
Patient was seen on 02/05/17 for the Core Session 6 of Diabetes Prevention Program course at [facility]. By the end of this session patients are able to complete the following objectives:   Learning Objectives:  Graph their daily physical activity.   Describe two ways of finding the time to be active.   Define "lifestyle activity."   Describe how to prevent injury.   Develop an activity plan for the coming week.   Goals:   Record weight taken outside of class.   Track foods and beverages eaten each day in the "Food and Activity Tracker," including calories and fat grams for each item.    Track activity type, minutes you were active, and distance you reached each day in the "Food and Activity Tracker."   Set aside one 20 to 30-minute block of time every day or find two or more periods of 10 to15 minutes each for physical activity.   Warm up, cool down, and stretch.  Make a Physical Activities Plan for the Week.   Follow-Up Plan:  Attend Core Session 7 next week.   Bring completed "Food and Activity Tracker" next week to be reviewed by Lifestyle Coach.

## 2017-02-12 ENCOUNTER — Encounter: Payer: Self-pay | Admitting: Registered"

## 2017-02-12 ENCOUNTER — Encounter: Payer: Medicare Other | Attending: Family Medicine | Admitting: Registered"

## 2017-02-12 DIAGNOSIS — R739 Hyperglycemia, unspecified: Secondary | ICD-10-CM

## 2017-02-12 NOTE — Progress Notes (Signed)
Patient was seen on 02/12/17 for the Core Session 7 of Diabetes Prevention Program course at [facility]. By the end of this session patients are able to complete the following objectives:   Learning Objectives:  Define calorie balance.  Explain how healthy eating and being active are related in terms of calorie balance.   Describe the relationship between calorie balance and weight loss.   Describe his or her progress as it relates to calorie balance.   Develop an activity plan for the coming week.   Goals:   Record weight taken outside of class.   Track foods and beverages eaten each day in the "Food and Activity Tracker," including calories and fat grams for each item.    Track activity type, minutes you were active, and distance you reached each day in the "Food and Activity Tracker."   Set aside one 20 to 30-minute block of time every day or find two or more periods of 10 to15 minutes each for physical activity.   Make a Physical Activities Plan for the Week.   Make active lifestyle choices all through the day   Stay at or go slightly over activity goal.   Follow-Up Plan:  Attend Core Session 8 next week.   Bring completed "Food and Activity Tracker" next week to be reviewed by Lifestyle Coach.

## 2017-02-19 ENCOUNTER — Encounter: Payer: Self-pay | Admitting: Registered"

## 2017-02-19 ENCOUNTER — Encounter: Payer: Medicare Other | Admitting: Registered"

## 2017-02-19 DIAGNOSIS — R739 Hyperglycemia, unspecified: Secondary | ICD-10-CM

## 2017-02-19 NOTE — Progress Notes (Signed)
Patient was seen on 02/19/17 for the Core Session 8 of Diabetes Prevention Program course at [facility]. By the end of this session patients are able to complete the following objectives:   Learning Objectives:  Recognize positive and negative food and activity cues.   Change negative food and activity cues to positive cues.   Add positive cues for activity and eliminate cues for inactivity.   Develop a plan for removing one problem food cue for the coming week.   Goals:   Record weight taken outside of class.   Track foods and beverages eaten each day in the "Food and Activity Tracker," including calories and fat grams for each item.    Track activity type, minutes you were active, and distance you reached each day in the "Food and Activity Tracker."   Set aside one 20 to 30-minute block of time every day or find two or more periods of 10 to15 minutes each for physical activity.   Remove one problem food cue.   Add one positive cue for being more active.  Follow-Up Plan:  Attend Core Session 9 next week.   Bring completed "Food and Activity Tracker" next week to be reviewed by Lifestyle Coach.

## 2017-02-26 ENCOUNTER — Encounter: Payer: Medicare Other | Admitting: Registered"

## 2017-02-26 ENCOUNTER — Encounter: Payer: Self-pay | Admitting: Registered"

## 2017-02-26 DIAGNOSIS — R739 Hyperglycemia, unspecified: Secondary | ICD-10-CM

## 2017-02-26 NOTE — Progress Notes (Signed)
Patient was seen on 02/26/17 for the Core Session 9 of Diabetes Prevention Program course at [facility]. By the end of this session patients are able to complete the following objectives:   Learning Objectives:  List and describe five steps to problem solving.   Apply the five problem solving steps to resolve a problem he or she has with eating less fat and fewer calories or being more active.   Goals:   Record weight taken outside of class.   Track foods and beverages eaten each day in the "Food and Activity Tracker," including calories and fat grams for each item.    Track activity type, minutes you were active, and distance you reached each day in the "Food and Activity Tracker."   Set aside one 20 to 30-minute block of time every day or find two or more periods of 10 to15 minutes each for physical activity.   Use problem solving action plan created during session to problem solve.   Follow-Up Plan:  Attend Core Session 10 next week.   Bring completed "Food and Activity Tracker" next week to be reviewed by Lifestyle Coach.  Bring menus from favorite restaurants to next session for future discussion.

## 2017-03-04 DIAGNOSIS — M19041 Primary osteoarthritis, right hand: Secondary | ICD-10-CM | POA: Diagnosis not present

## 2017-03-05 ENCOUNTER — Encounter: Payer: Medicare Other | Admitting: Registered"

## 2017-03-19 ENCOUNTER — Encounter: Payer: Medicare Other | Admitting: Registered"

## 2017-03-26 ENCOUNTER — Encounter: Payer: Medicare Other | Attending: Family Medicine | Admitting: Registered"

## 2017-03-26 ENCOUNTER — Encounter: Payer: Self-pay | Admitting: Registered"

## 2017-03-26 DIAGNOSIS — R739 Hyperglycemia, unspecified: Secondary | ICD-10-CM

## 2017-03-26 NOTE — Progress Notes (Signed)
Patient was seen on 03/26/17 for the Core Session 13 of Diabetes Prevention Program course at [facility]. By the end of this session patients are able to complete the following objectives:   Learning Objectives:  Describe ways to add interest and variety to their activity plans.  Define ?aerobic fitness.  Explain the four F.I.T.T. principles (frequency, intensity, time, and type of activity) and how they relate to aerobic fitness.   Goals:   Record weight taken outside of class.   Track foods and beverages eaten each day in the "Food and Activity Tracker," including calories and fat grams for each item.    Track activity type, minutes you were active, and distance you reached each day in the "Food and Activity Tracker."   Do your best to reach activity goal for the week.  Use one of the F.I.T.T. principles to jump start workouts.  Document activity level on the "To Do Next Week" handout.  Follow-Up Plan:  Attend Core Session 14 next week.   Bring completed "Food and Activity Tracker" next week to be reviewed by Lifestyle Coach.

## 2017-04-02 ENCOUNTER — Encounter: Payer: Medicare Other | Admitting: Registered"

## 2017-04-02 ENCOUNTER — Encounter: Payer: Self-pay | Admitting: Registered"

## 2017-04-02 DIAGNOSIS — R739 Hyperglycemia, unspecified: Secondary | ICD-10-CM

## 2017-04-02 NOTE — Progress Notes (Signed)
Patient was seen on 04/02/17 for the Core Session 14 of Diabetes Prevention Program course at [facility]. By the end of this session patients are able to complete the following objectives:   Learning Objectives:  Give examples of problem social cues and helpful social cues.   Explain how to remove problem social cues and add helpful ones.   Describe ways of coping with vacations and social events such as parties, holidays, and visits from relatives and friends.   Create an action plan to change a problem social cue and add a helpful one.   Goals:   Record weight taken outside of class.   Track foods and beverages eaten each day in the "Food and Activity Tracker," including calories and fat grams for each item.    Track activity type, minutes you were active, and distance you reached each day in the "Food and Activity Tracker."   Do your best to reach activity goal for the week.  Use action plan created during session to change a problem social cue and add a helpful social cue.   Answer questions regarding success of changing social cues on "To Do Next Week" handout.   Follow-Up Plan:  Attend Core Session 15 next week.   Bring completed "Food and Activity Tracker" next week to be reviewed by Lifestyle Coach.

## 2017-04-09 ENCOUNTER — Encounter: Payer: Self-pay | Admitting: Registered"

## 2017-04-09 ENCOUNTER — Encounter: Payer: Medicare Other | Admitting: Registered"

## 2017-04-09 DIAGNOSIS — R739 Hyperglycemia, unspecified: Secondary | ICD-10-CM

## 2017-04-09 DIAGNOSIS — R7309 Other abnormal glucose: Secondary | ICD-10-CM

## 2017-04-09 NOTE — Progress Notes (Signed)
Patient was seen on 04/09/17 for the Core Session 15 of Diabetes Prevention Program course at [facility]. By the end of this session patients are able to complete the following objectives:   Learning Objectives:  Explain how to prevent stress or cope with unavoidable stress.   Describe how this program can be a source of stress.   Explain how to manage stressful situations.   Create and follow an action plan for either preventing or coping with a stressful situation.   Goals:   Record weight taken outside of class.   Track foods and beverages eaten each day in the "Food and Activity Tracker," including calories and fat grams for each item.    Track activity type, minutes you were active, and distance you reached each day in the "Food and Activity Tracker."   Do your best to reach activity goal for the week.  Follow your action plan to reduce stress.   Answer questions on handout regarding success of action plan.   Follow-Up Plan:  Attend Core Session 16 next week.   Bring completed "Food and Activity Tracker" next week to be reviewed by Lifestyle Coach.

## 2017-04-16 ENCOUNTER — Encounter: Payer: Self-pay | Admitting: Registered"

## 2017-04-16 ENCOUNTER — Encounter: Payer: Medicare Other | Attending: Family Medicine | Admitting: Registered"

## 2017-04-16 DIAGNOSIS — R739 Hyperglycemia, unspecified: Secondary | ICD-10-CM

## 2017-04-16 NOTE — Progress Notes (Addendum)
Patient was seen on 04/16/17 for the Core Session 16 of Diabetes Prevention Program course at [facility]. By the end of this session patients are able to complete the following objectives:   Learning Objectives:  Measure their progress toward weight and physical activity goals since Session 1.   Develop a plan for improving progress, if their goals have not yet been attained.   Describe ways to stay motivated long-term.   Goals:   Record weight taken outside of class.   Track foods and beverages eaten each day in the "Food and Activity Tracker," including calories and fat grams for each item.    Track activity type, minutes you were active, and distance you reached each day in the "Food and Activity Tracker."   Utilize action plan to help stay motivated and complete questions on "To Do List."   Follow-Up Plan:  Attend session 17 in two weeks.   Bring completed "Food and Activity Tracker" next session to be reviewed by Lifestyle Coach.

## 2017-04-22 DIAGNOSIS — L299 Pruritus, unspecified: Secondary | ICD-10-CM | POA: Diagnosis not present

## 2017-04-22 DIAGNOSIS — D239 Other benign neoplasm of skin, unspecified: Secondary | ICD-10-CM | POA: Diagnosis not present

## 2017-04-23 ENCOUNTER — Encounter: Payer: Medicare Other | Admitting: Registered"

## 2017-04-26 DIAGNOSIS — J3089 Other allergic rhinitis: Secondary | ICD-10-CM | POA: Diagnosis not present

## 2017-04-26 DIAGNOSIS — J301 Allergic rhinitis due to pollen: Secondary | ICD-10-CM | POA: Diagnosis not present

## 2017-04-26 DIAGNOSIS — R05 Cough: Secondary | ICD-10-CM | POA: Diagnosis not present

## 2017-04-30 ENCOUNTER — Encounter: Payer: Medicare Other | Admitting: Registered"

## 2017-05-06 DIAGNOSIS — C61 Malignant neoplasm of prostate: Secondary | ICD-10-CM | POA: Diagnosis not present

## 2017-05-06 DIAGNOSIS — Z8546 Personal history of malignant neoplasm of prostate: Secondary | ICD-10-CM | POA: Diagnosis not present

## 2017-05-06 DIAGNOSIS — Z08 Encounter for follow-up examination after completed treatment for malignant neoplasm: Secondary | ICD-10-CM | POA: Diagnosis not present

## 2017-05-14 ENCOUNTER — Encounter: Payer: Medicare Other | Admitting: Registered"

## 2017-05-28 ENCOUNTER — Encounter: Payer: Medicare Other | Admitting: Registered"

## 2017-06-18 ENCOUNTER — Encounter: Payer: Medicare Other | Attending: Family Medicine | Admitting: Registered"

## 2017-06-18 ENCOUNTER — Encounter: Payer: Self-pay | Admitting: Registered"

## 2017-06-18 DIAGNOSIS — R7309 Other abnormal glucose: Secondary | ICD-10-CM

## 2017-06-18 NOTE — Progress Notes (Signed)
Patient was seen on 06/18/17 for Session 20 of Diabetes Prevention Program course at [facility]. By the end of this session patients are able to complete the following objectives:   Learning Objectives:  Define fiber and describe the difference between insoluble and soluble fiber   List foods that are good sources of fiber  Explain the health benefits of fiber   Describe ways to increase volume of meals and snacks while staying within fat goal.   Goals:   Record weight taken outside of class.   Track foods and beverages eaten each day in the "Food and Activity Tracker," including calories and fat grams for each item.    Track activity type, minutes you were active, and distance you reached each day in the "Food and Activity Tracker."   Follow-Up Plan:  Attend session 21  Bring completed "Food and Activity Trackers" to next session to be reviewed by Lifestyle Coach.

## 2017-07-16 ENCOUNTER — Encounter: Payer: Medicare Other | Admitting: Registered"

## 2017-07-30 ENCOUNTER — Encounter: Payer: Medicare Other | Admitting: Registered"

## 2017-08-13 ENCOUNTER — Encounter: Payer: Medicare Other | Admitting: Registered"

## 2017-08-17 DIAGNOSIS — L309 Dermatitis, unspecified: Secondary | ICD-10-CM | POA: Diagnosis not present

## 2017-10-08 ENCOUNTER — Encounter: Payer: Medicare Other | Admitting: Registered"

## 2017-10-15 ENCOUNTER — Encounter: Payer: Medicare Other | Attending: Family Medicine | Admitting: Registered"

## 2017-10-15 ENCOUNTER — Encounter: Payer: Self-pay | Admitting: Registered"

## 2017-10-15 DIAGNOSIS — R739 Hyperglycemia, unspecified: Secondary | ICD-10-CM

## 2017-10-15 NOTE — Progress Notes (Signed)
Patient was seen on 10/15/17 for the Diabetes Prevention Program course at [facility]. By the end of this session patients are able to complete the following objectives:   Learning Objectives:  Describe how to incorporate more fruits and vegetables into meals.  List criteria for selecting good quality fruits and vegetables at the store.   Define mindful eating.  List the benefits of eating mindfully.   Goals:   Record weight taken outside of class.   Track foods and beverages eaten each day in the "Food and Activity Tracker," including calories and fat grams for each item.    Track activity type, minutes you were active, and distance you reached each day in the "Food and Activity Tracker."   Follow-Up Plan:  Attend next session.   Bring completed "Food and Activity Trackers" to next session to be reviewed by Lifestyle Coach.

## 2017-10-25 DIAGNOSIS — Z23 Encounter for immunization: Secondary | ICD-10-CM | POA: Diagnosis not present

## 2017-11-04 DIAGNOSIS — R238 Other skin changes: Secondary | ICD-10-CM | POA: Diagnosis not present

## 2017-11-04 DIAGNOSIS — Z23 Encounter for immunization: Secondary | ICD-10-CM | POA: Diagnosis not present

## 2017-11-12 ENCOUNTER — Encounter: Payer: Medicare Other | Admitting: Registered"

## 2017-11-16 DIAGNOSIS — J3089 Other allergic rhinitis: Secondary | ICD-10-CM | POA: Diagnosis not present

## 2017-11-16 DIAGNOSIS — J301 Allergic rhinitis due to pollen: Secondary | ICD-10-CM | POA: Diagnosis not present

## 2017-11-30 DIAGNOSIS — R109 Unspecified abdominal pain: Secondary | ICD-10-CM | POA: Diagnosis not present

## 2017-11-30 DIAGNOSIS — M7661 Achilles tendinitis, right leg: Secondary | ICD-10-CM | POA: Diagnosis not present

## 2017-11-30 DIAGNOSIS — E7800 Pure hypercholesterolemia, unspecified: Secondary | ICD-10-CM | POA: Diagnosis not present

## 2017-11-30 DIAGNOSIS — R7303 Prediabetes: Secondary | ICD-10-CM | POA: Diagnosis not present

## 2017-11-30 DIAGNOSIS — Z0000 Encounter for general adult medical examination without abnormal findings: Secondary | ICD-10-CM | POA: Diagnosis not present

## 2017-11-30 DIAGNOSIS — E039 Hypothyroidism, unspecified: Secondary | ICD-10-CM | POA: Diagnosis not present

## 2017-11-30 DIAGNOSIS — Z8546 Personal history of malignant neoplasm of prostate: Secondary | ICD-10-CM | POA: Diagnosis not present

## 2017-11-30 DIAGNOSIS — Z79899 Other long term (current) drug therapy: Secondary | ICD-10-CM | POA: Diagnosis not present

## 2017-11-30 DIAGNOSIS — I10 Essential (primary) hypertension: Secondary | ICD-10-CM | POA: Diagnosis not present

## 2017-11-30 DIAGNOSIS — N281 Cyst of kidney, acquired: Secondary | ICD-10-CM | POA: Diagnosis not present

## 2017-11-30 DIAGNOSIS — K824 Cholesterolosis of gallbladder: Secondary | ICD-10-CM | POA: Diagnosis not present

## 2017-11-30 DIAGNOSIS — Z1211 Encounter for screening for malignant neoplasm of colon: Secondary | ICD-10-CM | POA: Diagnosis not present

## 2017-12-01 DIAGNOSIS — Z1211 Encounter for screening for malignant neoplasm of colon: Secondary | ICD-10-CM | POA: Diagnosis not present

## 2017-12-17 ENCOUNTER — Encounter: Payer: Medicare Other | Admitting: Registered"

## 2017-12-31 ENCOUNTER — Encounter: Payer: Medicare Other | Admitting: Registered"

## 2018-01-25 DIAGNOSIS — Z800 Family history of malignant neoplasm of digestive organs: Secondary | ICD-10-CM | POA: Diagnosis not present

## 2018-01-25 DIAGNOSIS — R1084 Generalized abdominal pain: Secondary | ICD-10-CM | POA: Diagnosis not present

## 2018-02-07 DIAGNOSIS — Z8371 Family history of colonic polyps: Secondary | ICD-10-CM | POA: Diagnosis not present

## 2018-02-07 DIAGNOSIS — R12 Heartburn: Secondary | ICD-10-CM | POA: Diagnosis not present

## 2018-02-07 DIAGNOSIS — Z800 Family history of malignant neoplasm of digestive organs: Secondary | ICD-10-CM | POA: Diagnosis not present

## 2018-02-07 DIAGNOSIS — D124 Benign neoplasm of descending colon: Secondary | ICD-10-CM | POA: Diagnosis not present

## 2018-02-07 DIAGNOSIS — D123 Benign neoplasm of transverse colon: Secondary | ICD-10-CM | POA: Diagnosis not present

## 2018-02-07 DIAGNOSIS — K449 Diaphragmatic hernia without obstruction or gangrene: Secondary | ICD-10-CM | POA: Diagnosis not present

## 2018-02-07 DIAGNOSIS — K5730 Diverticulosis of large intestine without perforation or abscess without bleeding: Secondary | ICD-10-CM | POA: Diagnosis not present

## 2018-02-07 DIAGNOSIS — R1084 Generalized abdominal pain: Secondary | ICD-10-CM | POA: Diagnosis not present

## 2018-02-09 DIAGNOSIS — D123 Benign neoplasm of transverse colon: Secondary | ICD-10-CM | POA: Diagnosis not present

## 2018-02-09 DIAGNOSIS — D124 Benign neoplasm of descending colon: Secondary | ICD-10-CM | POA: Diagnosis not present

## 2018-02-09 DIAGNOSIS — M7661 Achilles tendinitis, right leg: Secondary | ICD-10-CM | POA: Diagnosis not present

## 2018-06-09 DIAGNOSIS — Z8546 Personal history of malignant neoplasm of prostate: Secondary | ICD-10-CM | POA: Diagnosis not present

## 2018-06-09 DIAGNOSIS — Z08 Encounter for follow-up examination after completed treatment for malignant neoplasm: Secondary | ICD-10-CM | POA: Diagnosis not present

## 2018-06-09 DIAGNOSIS — C61 Malignant neoplasm of prostate: Secondary | ICD-10-CM | POA: Diagnosis not present

## 2018-10-18 DIAGNOSIS — Z23 Encounter for immunization: Secondary | ICD-10-CM | POA: Diagnosis not present

## 2018-11-01 DIAGNOSIS — D485 Neoplasm of uncertain behavior of skin: Secondary | ICD-10-CM | POA: Diagnosis not present

## 2018-11-01 DIAGNOSIS — L723 Sebaceous cyst: Secondary | ICD-10-CM | POA: Diagnosis not present

## 2018-11-01 DIAGNOSIS — Z23 Encounter for immunization: Secondary | ICD-10-CM | POA: Diagnosis not present

## 2018-11-01 DIAGNOSIS — C4401 Basal cell carcinoma of skin of lip: Secondary | ICD-10-CM | POA: Diagnosis not present

## 2018-11-17 DIAGNOSIS — G5602 Carpal tunnel syndrome, left upper limb: Secondary | ICD-10-CM | POA: Diagnosis not present

## 2018-11-17 DIAGNOSIS — J301 Allergic rhinitis due to pollen: Secondary | ICD-10-CM | POA: Diagnosis not present

## 2018-11-17 DIAGNOSIS — J3089 Other allergic rhinitis: Secondary | ICD-10-CM | POA: Diagnosis not present

## 2018-11-23 DIAGNOSIS — M2040 Other hammer toe(s) (acquired), unspecified foot: Secondary | ICD-10-CM | POA: Diagnosis not present

## 2018-12-02 DIAGNOSIS — G5602 Carpal tunnel syndrome, left upper limb: Secondary | ICD-10-CM | POA: Diagnosis not present

## 2018-12-13 DIAGNOSIS — G5602 Carpal tunnel syndrome, left upper limb: Secondary | ICD-10-CM | POA: Diagnosis not present

## 2018-12-22 DIAGNOSIS — C4401 Basal cell carcinoma of skin of lip: Secondary | ICD-10-CM | POA: Diagnosis not present

## 2019-01-30 DIAGNOSIS — Z79899 Other long term (current) drug therapy: Secondary | ICD-10-CM | POA: Diagnosis not present

## 2019-01-30 DIAGNOSIS — E039 Hypothyroidism, unspecified: Secondary | ICD-10-CM | POA: Diagnosis not present

## 2019-01-30 DIAGNOSIS — R7303 Prediabetes: Secondary | ICD-10-CM | POA: Diagnosis not present

## 2019-01-30 DIAGNOSIS — C61 Malignant neoplasm of prostate: Secondary | ICD-10-CM | POA: Diagnosis not present

## 2019-02-02 DIAGNOSIS — I10 Essential (primary) hypertension: Secondary | ICD-10-CM | POA: Diagnosis not present

## 2019-02-02 DIAGNOSIS — Z0000 Encounter for general adult medical examination without abnormal findings: Secondary | ICD-10-CM | POA: Diagnosis not present

## 2019-02-02 DIAGNOSIS — E039 Hypothyroidism, unspecified: Secondary | ICD-10-CM | POA: Diagnosis not present

## 2019-02-02 DIAGNOSIS — R7303 Prediabetes: Secondary | ICD-10-CM | POA: Diagnosis not present

## 2019-02-06 DIAGNOSIS — Z85828 Personal history of other malignant neoplasm of skin: Secondary | ICD-10-CM | POA: Diagnosis not present

## 2019-02-06 DIAGNOSIS — L814 Other melanin hyperpigmentation: Secondary | ICD-10-CM | POA: Diagnosis not present

## 2019-02-06 DIAGNOSIS — L821 Other seborrheic keratosis: Secondary | ICD-10-CM | POA: Diagnosis not present

## 2019-02-06 DIAGNOSIS — L578 Other skin changes due to chronic exposure to nonionizing radiation: Secondary | ICD-10-CM | POA: Diagnosis not present

## 2019-05-17 DIAGNOSIS — Z0120 Encounter for dental examination and cleaning without abnormal findings: Secondary | ICD-10-CM | POA: Diagnosis not present

## 2019-05-21 IMAGING — RF DG FINGER LITTLE 2+V*R*
1 series · 2 of 2 positions shown · non-contrast
Comparison: None.

CLINICAL DATA: Arthrodesis of the right fifth digit

EXAM:
RIGHT LITTLE FINGER 2+V

[Series 1: run · 2 of 2 slices shown]
[im 1/2]
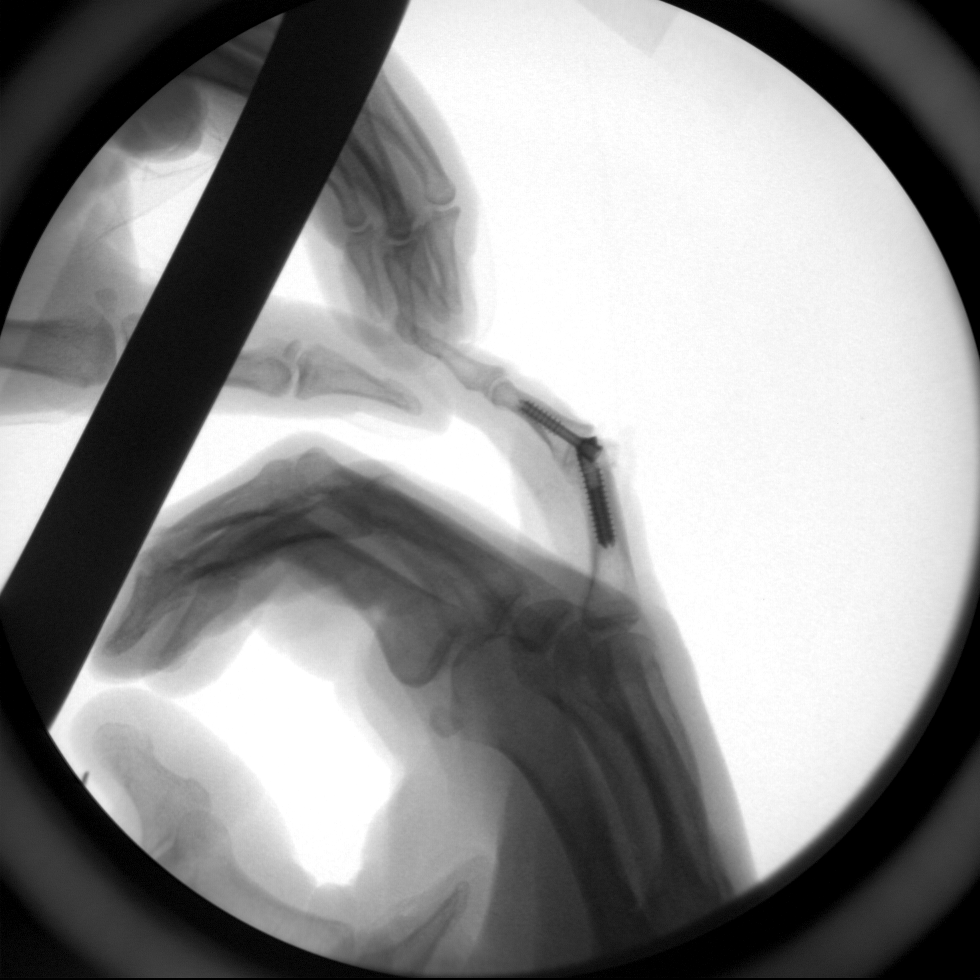
[im 2/2]
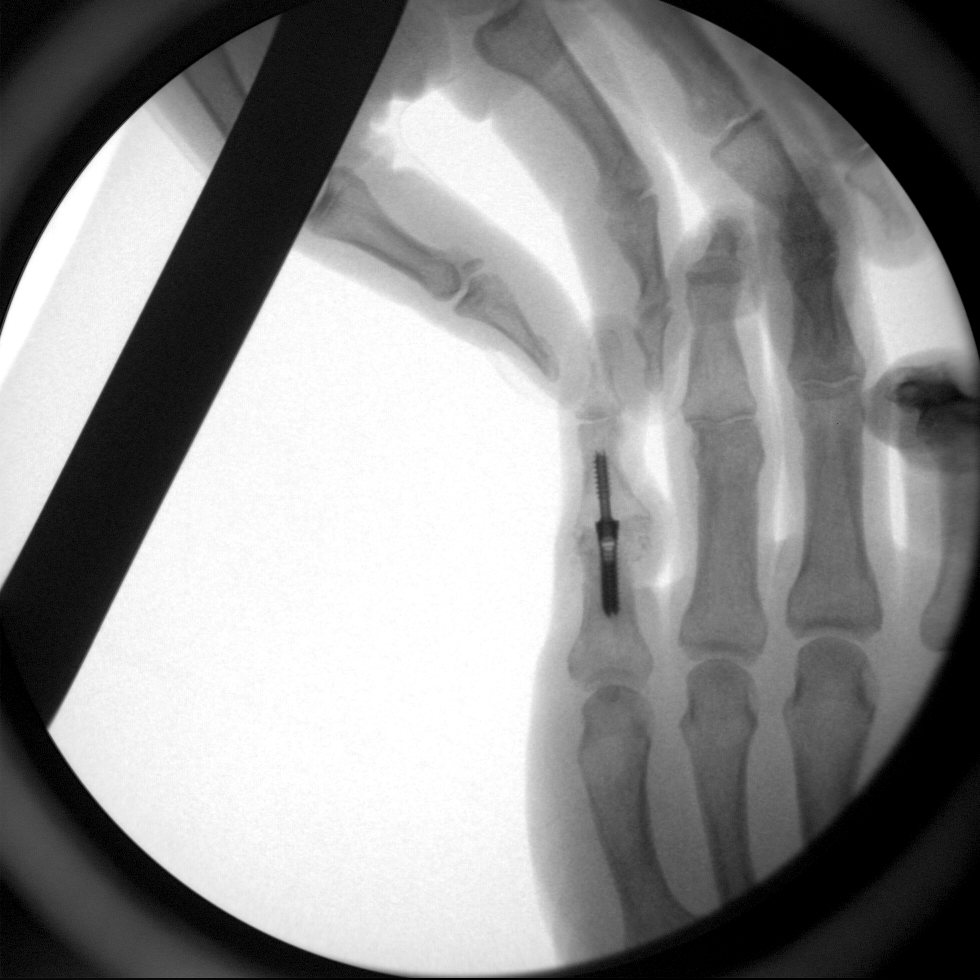

[2 of 2 positions shown; findings below may reference images not displayed]

FINDINGS: Two C-arm spot films show arthrodesis of the PIP joint of the fifth
digit. No complicating features are seen.
IMPRESSION: Arthrodesis of the right fifth PIP joint.

## 2019-06-06 DIAGNOSIS — Z8546 Personal history of malignant neoplasm of prostate: Secondary | ICD-10-CM | POA: Diagnosis not present

## 2019-06-06 DIAGNOSIS — Z08 Encounter for follow-up examination after completed treatment for malignant neoplasm: Secondary | ICD-10-CM | POA: Diagnosis not present

## 2019-06-06 DIAGNOSIS — C61 Malignant neoplasm of prostate: Secondary | ICD-10-CM | POA: Diagnosis not present

## 2019-08-15 DIAGNOSIS — R2231 Localized swelling, mass and lump, right upper limb: Secondary | ICD-10-CM | POA: Diagnosis not present

## 2019-08-17 ENCOUNTER — Other Ambulatory Visit: Payer: Self-pay | Admitting: Orthopedic Surgery

## 2019-08-17 DIAGNOSIS — M79631 Pain in right forearm: Secondary | ICD-10-CM

## 2019-09-16 ENCOUNTER — Other Ambulatory Visit: Payer: Medicare Other

## 2019-09-19 ENCOUNTER — Ambulatory Visit
Admission: RE | Admit: 2019-09-19 | Discharge: 2019-09-19 | Disposition: A | Discharge status: Home / Self Care | Payer: Medicare Other | Source: Ambulatory Visit | Attending: Orthopedic Surgery | Admitting: Orthopedic Surgery

## 2019-09-19 ENCOUNTER — Other Ambulatory Visit: Payer: Self-pay

## 2019-09-19 DIAGNOSIS — M65841 Other synovitis and tenosynovitis, right hand: Secondary | ICD-10-CM | POA: Diagnosis not present

## 2019-09-19 DIAGNOSIS — M79631 Pain in right forearm: Secondary | ICD-10-CM | POA: Diagnosis not present

## 2019-09-19 DIAGNOSIS — M19031 Primary osteoarthritis, right wrist: Secondary | ICD-10-CM | POA: Diagnosis not present

## 2019-09-19 DIAGNOSIS — M67431 Ganglion, right wrist: Secondary | ICD-10-CM | POA: Diagnosis not present

## 2019-09-19 MED ORDER — GADOBENATE DIMEGLUMINE 529 MG/ML IV SOLN
17.0000 mL | Freq: Once | INTRAVENOUS | Status: AC | PRN
  Administered 2019-09-19: 17 mL via INTRAVENOUS

## 2019-10-05 DIAGNOSIS — R2231 Localized swelling, mass and lump, right upper limb: Secondary | ICD-10-CM | POA: Diagnosis not present

## 2019-10-30 DIAGNOSIS — R2231 Localized swelling, mass and lump, right upper limb: Secondary | ICD-10-CM | POA: Diagnosis not present

## 2019-11-14 DIAGNOSIS — Z20822 Contact with and (suspected) exposure to covid-19: Secondary | ICD-10-CM | POA: Diagnosis not present

## 2019-11-20 DIAGNOSIS — J301 Allergic rhinitis due to pollen: Secondary | ICD-10-CM | POA: Diagnosis not present

## 2019-11-20 DIAGNOSIS — J3089 Other allergic rhinitis: Secondary | ICD-10-CM | POA: Diagnosis not present

## 2019-12-05 DIAGNOSIS — Z0120 Encounter for dental examination and cleaning without abnormal findings: Secondary | ICD-10-CM | POA: Diagnosis not present

## 2020-01-24 DIAGNOSIS — Z1152 Encounter for screening for COVID-19: Secondary | ICD-10-CM | POA: Diagnosis not present

## 2020-02-07 DIAGNOSIS — D225 Melanocytic nevi of trunk: Secondary | ICD-10-CM | POA: Diagnosis not present

## 2020-02-07 DIAGNOSIS — Z85828 Personal history of other malignant neoplasm of skin: Secondary | ICD-10-CM | POA: Diagnosis not present

## 2020-02-07 DIAGNOSIS — L578 Other skin changes due to chronic exposure to nonionizing radiation: Secondary | ICD-10-CM | POA: Diagnosis not present

## 2020-02-07 DIAGNOSIS — D485 Neoplasm of uncertain behavior of skin: Secondary | ICD-10-CM | POA: Diagnosis not present

## 2020-02-07 DIAGNOSIS — L814 Other melanin hyperpigmentation: Secondary | ICD-10-CM | POA: Diagnosis not present

## 2020-02-07 DIAGNOSIS — L821 Other seborrheic keratosis: Secondary | ICD-10-CM | POA: Diagnosis not present

## 2020-03-21 DIAGNOSIS — Z0000 Encounter for general adult medical examination without abnormal findings: Secondary | ICD-10-CM | POA: Diagnosis not present

## 2020-03-21 DIAGNOSIS — C61 Malignant neoplasm of prostate: Secondary | ICD-10-CM | POA: Diagnosis not present

## 2020-03-21 DIAGNOSIS — E039 Hypothyroidism, unspecified: Secondary | ICD-10-CM | POA: Diagnosis not present

## 2020-03-21 DIAGNOSIS — I10 Essential (primary) hypertension: Secondary | ICD-10-CM | POA: Diagnosis not present

## 2020-06-18 DIAGNOSIS — Z08 Encounter for follow-up examination after completed treatment for malignant neoplasm: Secondary | ICD-10-CM | POA: Diagnosis not present

## 2020-06-18 DIAGNOSIS — Z8546 Personal history of malignant neoplasm of prostate: Secondary | ICD-10-CM | POA: Diagnosis not present

## 2020-06-18 DIAGNOSIS — C61 Malignant neoplasm of prostate: Secondary | ICD-10-CM | POA: Diagnosis not present

## 2020-10-17 DIAGNOSIS — B070 Plantar wart: Secondary | ICD-10-CM | POA: Diagnosis not present

## 2020-10-30 DIAGNOSIS — Z0100 Encounter for examination of eyes and vision without abnormal findings: Secondary | ICD-10-CM | POA: Diagnosis not present

## 2020-11-14 DIAGNOSIS — Z23 Encounter for immunization: Secondary | ICD-10-CM | POA: Diagnosis not present

## 2020-11-14 DIAGNOSIS — B070 Plantar wart: Secondary | ICD-10-CM | POA: Diagnosis not present

## 2020-11-18 DIAGNOSIS — J301 Allergic rhinitis due to pollen: Secondary | ICD-10-CM | POA: Diagnosis not present

## 2020-11-18 DIAGNOSIS — J3089 Other allergic rhinitis: Secondary | ICD-10-CM | POA: Diagnosis not present

## 2020-12-12 DIAGNOSIS — Z23 Encounter for immunization: Secondary | ICD-10-CM | POA: Diagnosis not present

## 2020-12-12 DIAGNOSIS — B070 Plantar wart: Secondary | ICD-10-CM | POA: Diagnosis not present

## 2021-01-31 DIAGNOSIS — M25512 Pain in left shoulder: Secondary | ICD-10-CM | POA: Diagnosis not present

## 2021-02-05 DIAGNOSIS — L821 Other seborrheic keratosis: Secondary | ICD-10-CM | POA: Diagnosis not present

## 2021-02-05 DIAGNOSIS — Z85828 Personal history of other malignant neoplasm of skin: Secondary | ICD-10-CM | POA: Diagnosis not present

## 2021-02-05 DIAGNOSIS — L578 Other skin changes due to chronic exposure to nonionizing radiation: Secondary | ICD-10-CM | POA: Diagnosis not present

## 2021-02-05 DIAGNOSIS — L814 Other melanin hyperpigmentation: Secondary | ICD-10-CM | POA: Diagnosis not present

## 2021-02-12 DIAGNOSIS — H43313 Vitreous membranes and strands, bilateral: Secondary | ICD-10-CM | POA: Diagnosis not present

## 2021-02-12 DIAGNOSIS — H18413 Arcus senilis, bilateral: Secondary | ICD-10-CM | POA: Diagnosis not present

## 2021-02-12 DIAGNOSIS — H43811 Vitreous degeneration, right eye: Secondary | ICD-10-CM | POA: Diagnosis not present

## 2021-02-12 DIAGNOSIS — H26491 Other secondary cataract, right eye: Secondary | ICD-10-CM | POA: Diagnosis not present

## 2021-02-13 DIAGNOSIS — H26491 Other secondary cataract, right eye: Secondary | ICD-10-CM | POA: Diagnosis not present

## 2021-02-19 DIAGNOSIS — M25512 Pain in left shoulder: Secondary | ICD-10-CM | POA: Diagnosis not present

## 2021-02-20 DIAGNOSIS — E039 Hypothyroidism, unspecified: Secondary | ICD-10-CM | POA: Diagnosis not present

## 2021-03-14 ENCOUNTER — Other Ambulatory Visit: Payer: Self-pay | Admitting: Orthopedic Surgery

## 2021-03-14 DIAGNOSIS — M25512 Pain in left shoulder: Secondary | ICD-10-CM | POA: Diagnosis not present

## 2021-04-01 ENCOUNTER — Other Ambulatory Visit: Payer: Self-pay

## 2021-04-01 ENCOUNTER — Other Ambulatory Visit: Payer: Medicare Other

## 2021-04-01 ENCOUNTER — Ambulatory Visit
Admission: RE | Admit: 2021-04-01 | Discharge: 2021-04-01 | Disposition: A | Discharge status: Home / Self Care | Payer: Medicare Other | Source: Ambulatory Visit | Attending: Orthopedic Surgery | Admitting: Orthopedic Surgery

## 2021-04-01 DIAGNOSIS — M25512 Pain in left shoulder: Secondary | ICD-10-CM

## 2021-04-07 DIAGNOSIS — M75122 Complete rotator cuff tear or rupture of left shoulder, not specified as traumatic: Secondary | ICD-10-CM | POA: Diagnosis not present

## 2021-04-30 DIAGNOSIS — Z0000 Encounter for general adult medical examination without abnormal findings: Secondary | ICD-10-CM | POA: Diagnosis not present

## 2021-04-30 DIAGNOSIS — Z79899 Other long term (current) drug therapy: Secondary | ICD-10-CM | POA: Diagnosis not present

## 2021-04-30 DIAGNOSIS — E7800 Pure hypercholesterolemia, unspecified: Secondary | ICD-10-CM | POA: Diagnosis not present

## 2021-04-30 DIAGNOSIS — C61 Malignant neoplasm of prostate: Secondary | ICD-10-CM | POA: Diagnosis not present

## 2021-04-30 DIAGNOSIS — E039 Hypothyroidism, unspecified: Secondary | ICD-10-CM | POA: Diagnosis not present

## 2021-04-30 DIAGNOSIS — R7303 Prediabetes: Secondary | ICD-10-CM | POA: Diagnosis not present

## 2021-04-30 DIAGNOSIS — I10 Essential (primary) hypertension: Secondary | ICD-10-CM | POA: Diagnosis not present

## 2021-05-02 DIAGNOSIS — H16143 Punctate keratitis, bilateral: Secondary | ICD-10-CM | POA: Diagnosis not present

## 2021-06-17 DIAGNOSIS — Z8546 Personal history of malignant neoplasm of prostate: Secondary | ICD-10-CM | POA: Diagnosis not present

## 2021-06-17 DIAGNOSIS — N528 Other male erectile dysfunction: Secondary | ICD-10-CM | POA: Diagnosis not present

## 2021-06-17 DIAGNOSIS — Z08 Encounter for follow-up examination after completed treatment for malignant neoplasm: Secondary | ICD-10-CM | POA: Diagnosis not present

## 2021-06-17 DIAGNOSIS — N401 Enlarged prostate with lower urinary tract symptoms: Secondary | ICD-10-CM | POA: Diagnosis not present

## 2021-06-17 DIAGNOSIS — R351 Nocturia: Secondary | ICD-10-CM | POA: Diagnosis not present

## 2021-06-17 DIAGNOSIS — C61 Malignant neoplasm of prostate: Secondary | ICD-10-CM | POA: Diagnosis not present

## 2021-08-21 DIAGNOSIS — H52203 Unspecified astigmatism, bilateral: Secondary | ICD-10-CM | POA: Diagnosis not present

## 2021-10-14 DIAGNOSIS — J301 Allergic rhinitis due to pollen: Secondary | ICD-10-CM | POA: Diagnosis not present

## 2021-10-14 DIAGNOSIS — J3089 Other allergic rhinitis: Secondary | ICD-10-CM | POA: Diagnosis not present

## 2021-11-19 DIAGNOSIS — Z0100 Encounter for examination of eyes and vision without abnormal findings: Secondary | ICD-10-CM | POA: Diagnosis not present

## 2022-01-30 DIAGNOSIS — K080 Exfoliation of teeth due to systemic causes: Secondary | ICD-10-CM | POA: Diagnosis not present

## 2022-02-15 IMAGING — MR MR FOREARM*R* WO/W CM
6 of 9 series · 22 of 40 positions shown · IV contrast (multihance)
Comparison: None.

CLINICAL DATA: Palpable lump on the lateral aspect of the distal
forearm with soreness for 4-5 months

EXAM:
MRI OF THE RIGHT FOREARM WITHOUT AND WITH CONTRAST
TECHNIQUE: Multiplanar, multisequence MR imaging of the right forearm was
performed before and after the administration of intravenous
contrast.
CONTRAST:  17mL MULTIHANCE GADOBENATE DIMEGLUMINE 529 MG/ML IV SOLN

[Series 6: T1 · axial · 5.0mm · 0.23mm/px · z∈[-100,+74]mm · 5 of 30 slices shown (1 of 2)]
[im 1/30]
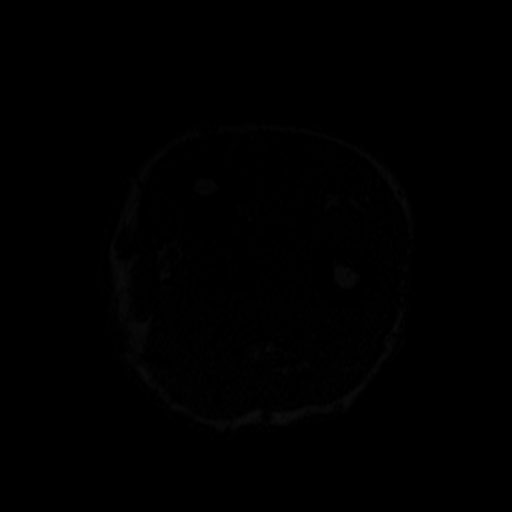
[im 8/30]
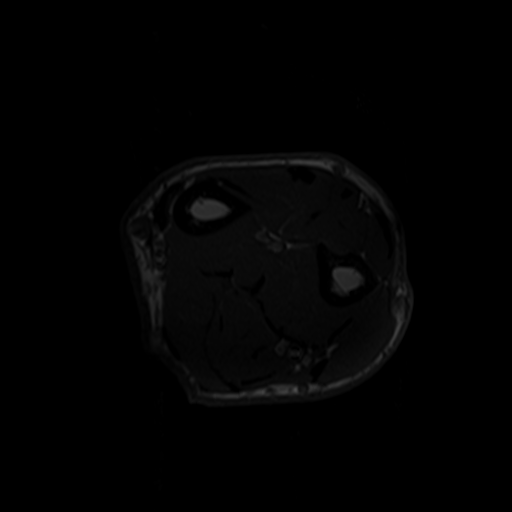
[im 15/30]
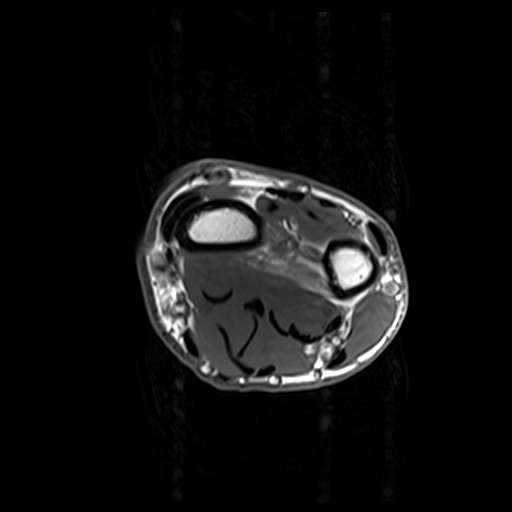
[im 22/30]
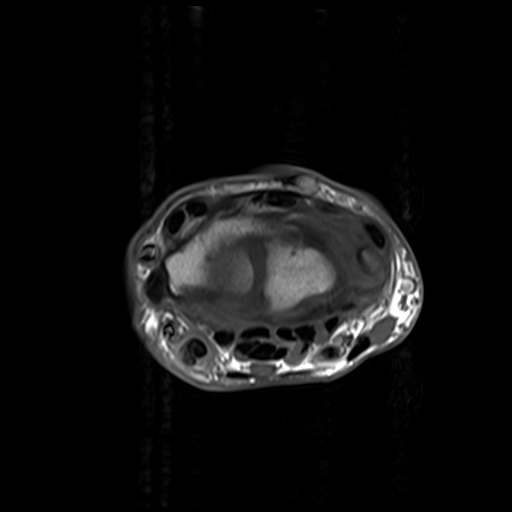
[im 30/30]
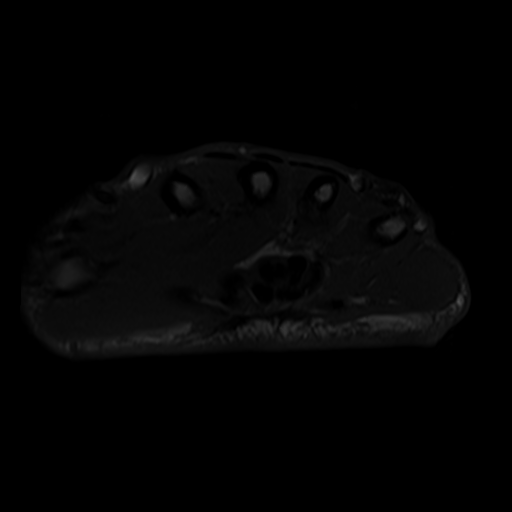

[Series 7: T2 fat-sat · axial · 5.0mm · 0.23mm/px · z∈[-94,+80]mm · 6 of 30 slices shown (1 of 2)]
[im 1/30]
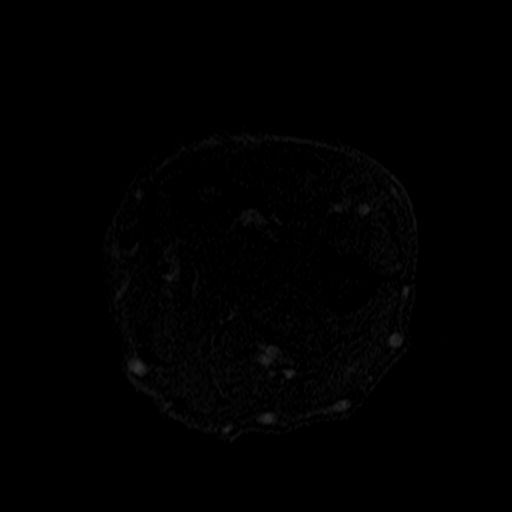
[im 6/30]
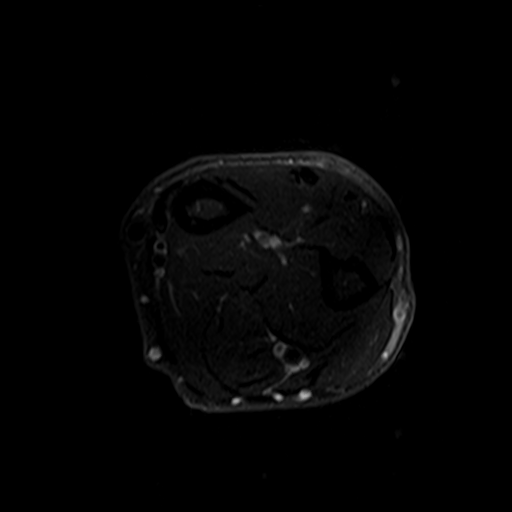
[im 12/30]
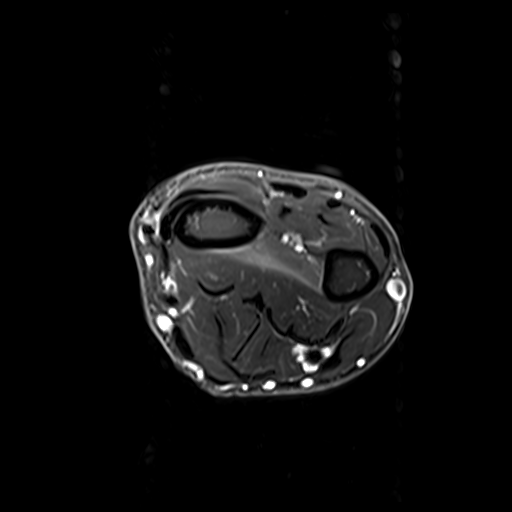
[im 18/30]
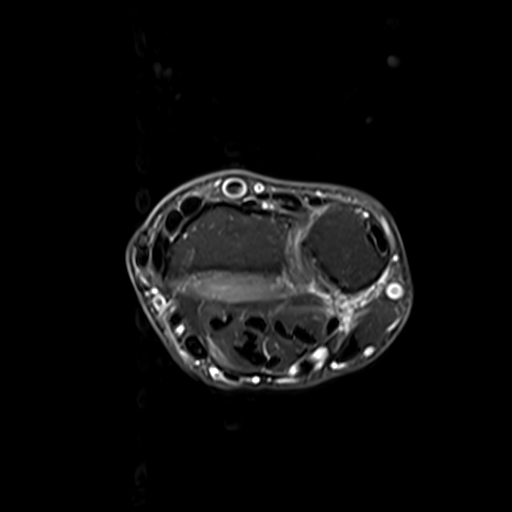
[im 24/30]
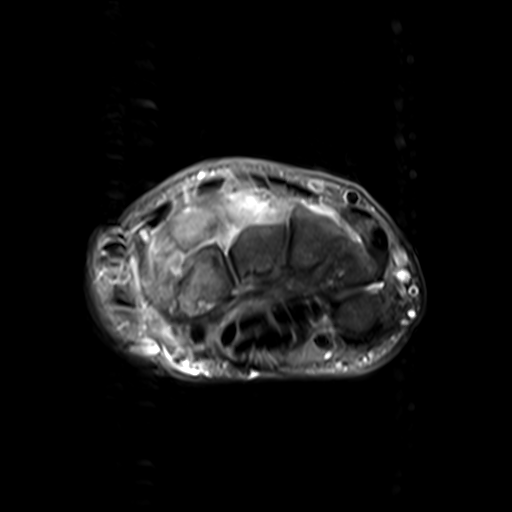
[im 30/30]
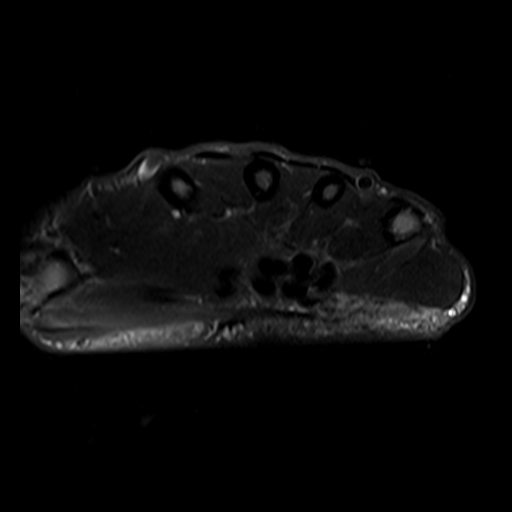

[Series 8: T1 · coronal · 4.0mm · 0.55mm/px · 3 of 17 slices shown (2 of 2)]
[im 1/17]
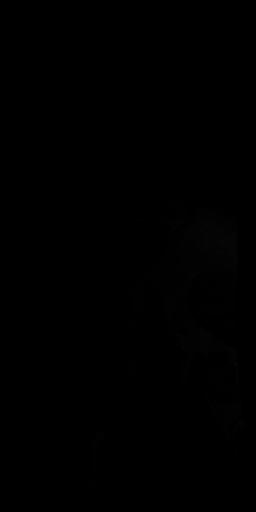
[im 9/17]
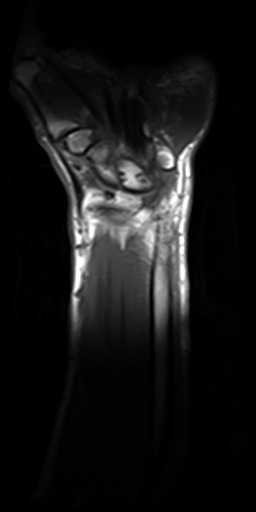
[im 17/17]
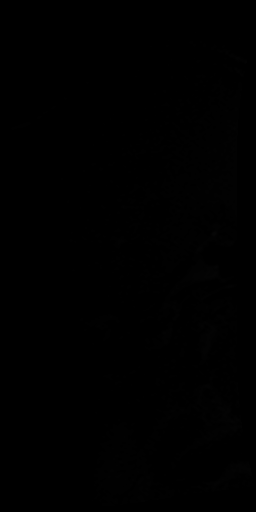

[Series 9: T2 fat-sat · coronal · 4.0mm · 0.55mm/px · 3 of 17 slices shown (2 of 2)]
[im 1/17]
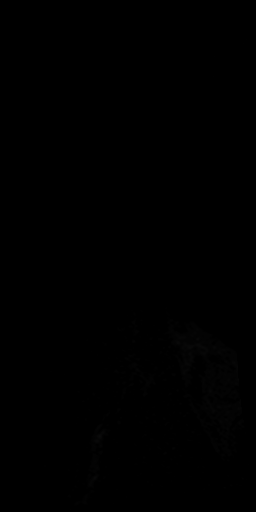
[im 9/17]
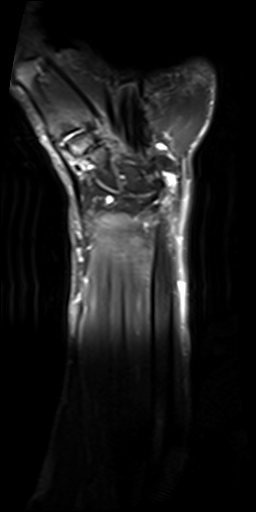
[im 17/17]
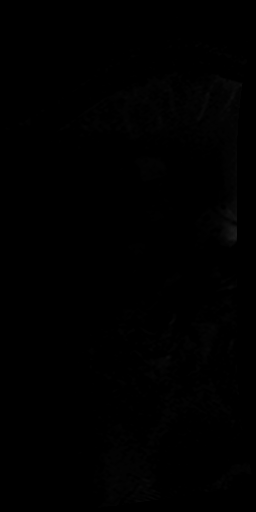

[Series 10: STIR · sagittal · 4.0mm · 0.51mm/px · 4 of 23 slices shown]
[im 1/23]
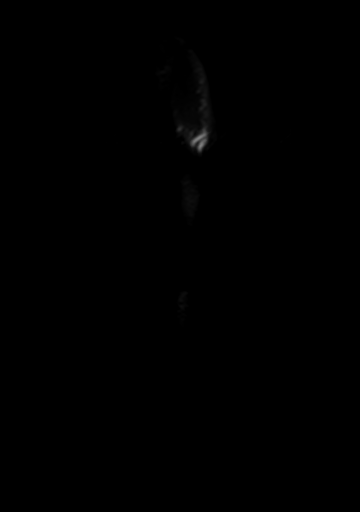
[im 8/23]
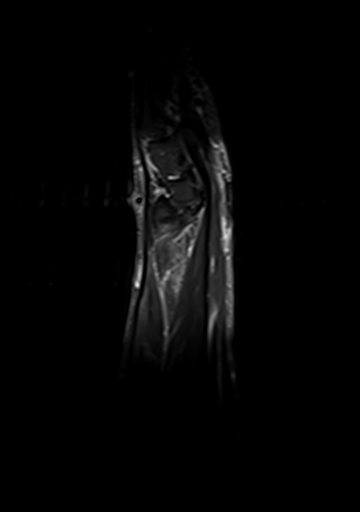
[im 15/23]
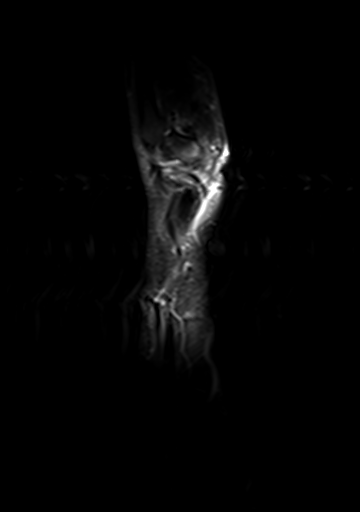
[im 23/23]
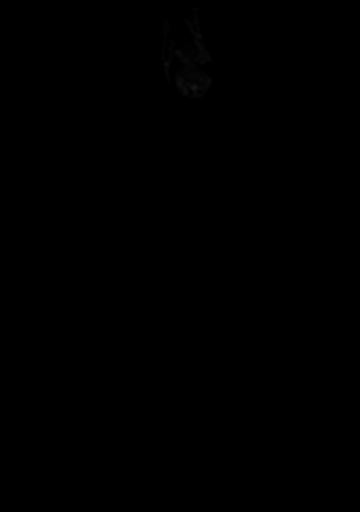

[Series 11: pre fs axial · axial · non-contrast · 5.0mm · 0.28mm/px · 1 of 30 slices shown]
[im 1/30]
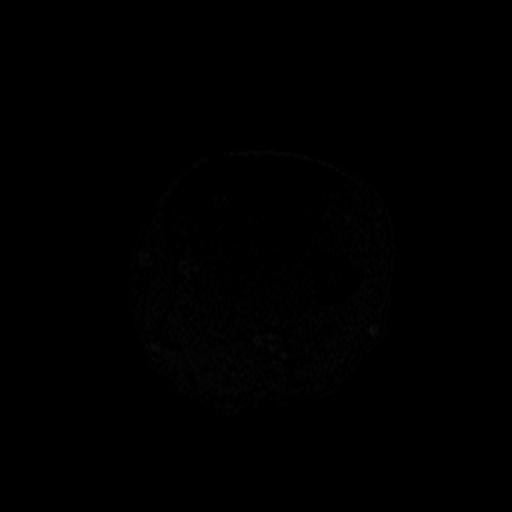

[22 of 40 positions shown; findings below may reference images not displayed]

FINDINGS: Bones/Joint/Cartilage

No acute fracture. No dislocation. Moderate degenerative changes
involving the first CMC and triscaphe joints. Mild degenerative
changes of the radiocarpal joint with 5 mm subchondral cyst at the
volar aspect of the distal radius. Incidentally noted is a 6 mm
lobulated T2 hyperintense focus within the distal radial metaphysis
without enhancement, likely reflecting a small chondroid lesion such
as an enchondroma. No suspicious bone lesion.

Ligaments

Grossly intact.

Muscles and Tendons

Focal tendinosis and tenosynovitis of the flexor carpi radialis
tendon overlying the level of the radial styloid (series 7, images
20-21). Mild tendinosis of the extensor carpi ulnaris tendon. The
remaining flexor and extensor tendons appear within normal limits.
No intramuscular edema or fluid collection.

Soft tissues

There is a multilobulated T2 hyperintense cystic structure emanating
into the soft tissues from the volar aspect of the radiocarpal joint
at the radioscaphoid articulation which measures 1.2 x 0.5 x 1.7 cm
(series 7, image 20; series 10, image 13) suggestive of a ganglion
cyst. There is an additional 0.6 cm probable ganglion just volar to
the ulnar styloid. Soft tissues are otherwise within normal limits.
No enhancing soft tissue mass.
IMPRESSION: 1. There are two abnormalities at the radial-volar aspect of the
distal forearm which may correspond to patient's palpable
abnormality. A 1.7 cm ganglion cyst is located at the volar aspect
of the radioscaphoid articulation. There is also focal tendinosis
and tenosynovitis of the flexor carpi radialis tendon overlying the
level of the radial styloid.
2. Mild tendinosis of the extensor carpi ulnaris tendon.
3. Moderate degenerative changes of the first CMC and triscaphe
joints.

## 2022-02-16 DIAGNOSIS — D485 Neoplasm of uncertain behavior of skin: Secondary | ICD-10-CM | POA: Diagnosis not present

## 2022-02-16 DIAGNOSIS — L578 Other skin changes due to chronic exposure to nonionizing radiation: Secondary | ICD-10-CM | POA: Diagnosis not present

## 2022-02-16 DIAGNOSIS — L814 Other melanin hyperpigmentation: Secondary | ICD-10-CM | POA: Diagnosis not present

## 2022-02-16 DIAGNOSIS — Z85828 Personal history of other malignant neoplasm of skin: Secondary | ICD-10-CM | POA: Diagnosis not present

## 2022-02-16 DIAGNOSIS — D225 Melanocytic nevi of trunk: Secondary | ICD-10-CM | POA: Diagnosis not present

## 2022-02-16 DIAGNOSIS — L821 Other seborrheic keratosis: Secondary | ICD-10-CM | POA: Diagnosis not present

## 2022-02-19 DIAGNOSIS — K080 Exfoliation of teeth due to systemic causes: Secondary | ICD-10-CM | POA: Diagnosis not present

## 2022-03-10 DIAGNOSIS — K080 Exfoliation of teeth due to systemic causes: Secondary | ICD-10-CM | POA: Diagnosis not present

## 2022-05-05 DIAGNOSIS — F325 Major depressive disorder, single episode, in full remission: Secondary | ICD-10-CM | POA: Diagnosis not present

## 2022-05-05 DIAGNOSIS — Z79899 Other long term (current) drug therapy: Secondary | ICD-10-CM | POA: Diagnosis not present

## 2022-05-05 DIAGNOSIS — R7303 Prediabetes: Secondary | ICD-10-CM | POA: Diagnosis not present

## 2022-05-05 DIAGNOSIS — E892 Postprocedural hypoparathyroidism: Secondary | ICD-10-CM | POA: Diagnosis not present

## 2022-05-05 DIAGNOSIS — C61 Malignant neoplasm of prostate: Secondary | ICD-10-CM | POA: Diagnosis not present

## 2022-05-05 DIAGNOSIS — E7800 Pure hypercholesterolemia, unspecified: Secondary | ICD-10-CM | POA: Diagnosis not present

## 2022-05-05 DIAGNOSIS — Z0000 Encounter for general adult medical examination without abnormal findings: Secondary | ICD-10-CM | POA: Diagnosis not present

## 2022-05-05 DIAGNOSIS — E039 Hypothyroidism, unspecified: Secondary | ICD-10-CM | POA: Diagnosis not present

## 2022-05-05 DIAGNOSIS — R42 Dizziness and giddiness: Secondary | ICD-10-CM | POA: Diagnosis not present

## 2022-05-22 DIAGNOSIS — H524 Presbyopia: Secondary | ICD-10-CM | POA: Diagnosis not present

## 2022-06-30 DIAGNOSIS — M25561 Pain in right knee: Secondary | ICD-10-CM | POA: Diagnosis not present

## 2022-06-30 DIAGNOSIS — Z96652 Presence of left artificial knee joint: Secondary | ICD-10-CM | POA: Diagnosis not present

## 2022-06-30 DIAGNOSIS — Z471 Aftercare following joint replacement surgery: Secondary | ICD-10-CM | POA: Diagnosis not present

## 2022-06-30 DIAGNOSIS — M25562 Pain in left knee: Secondary | ICD-10-CM | POA: Diagnosis not present

## 2022-06-30 DIAGNOSIS — Z96651 Presence of right artificial knee joint: Secondary | ICD-10-CM | POA: Diagnosis not present

## 2022-07-07 DIAGNOSIS — N401 Enlarged prostate with lower urinary tract symptoms: Secondary | ICD-10-CM | POA: Diagnosis not present

## 2022-07-07 DIAGNOSIS — R351 Nocturia: Secondary | ICD-10-CM | POA: Diagnosis not present

## 2022-07-07 DIAGNOSIS — N528 Other male erectile dysfunction: Secondary | ICD-10-CM | POA: Diagnosis not present

## 2022-07-07 DIAGNOSIS — Z8546 Personal history of malignant neoplasm of prostate: Secondary | ICD-10-CM | POA: Diagnosis not present

## 2022-07-07 DIAGNOSIS — C61 Malignant neoplasm of prostate: Secondary | ICD-10-CM | POA: Diagnosis not present

## 2022-07-07 DIAGNOSIS — Z08 Encounter for follow-up examination after completed treatment for malignant neoplasm: Secondary | ICD-10-CM | POA: Diagnosis not present

## 2022-08-25 DIAGNOSIS — K080 Exfoliation of teeth due to systemic causes: Secondary | ICD-10-CM | POA: Diagnosis not present

## 2022-09-22 DIAGNOSIS — K080 Exfoliation of teeth due to systemic causes: Secondary | ICD-10-CM | POA: Diagnosis not present

## 2022-10-07 DIAGNOSIS — U071 COVID-19: Secondary | ICD-10-CM | POA: Diagnosis not present

## 2022-10-29 DIAGNOSIS — J3089 Other allergic rhinitis: Secondary | ICD-10-CM | POA: Diagnosis not present

## 2022-10-29 DIAGNOSIS — J301 Allergic rhinitis due to pollen: Secondary | ICD-10-CM | POA: Diagnosis not present

## 2022-11-20 DIAGNOSIS — C61 Malignant neoplasm of prostate: Secondary | ICD-10-CM | POA: Diagnosis not present

## 2023-02-02 DIAGNOSIS — R1013 Epigastric pain: Secondary | ICD-10-CM | POA: Diagnosis not present

## 2023-03-08 DIAGNOSIS — Z85828 Personal history of other malignant neoplasm of skin: Secondary | ICD-10-CM | POA: Diagnosis not present

## 2023-03-08 DIAGNOSIS — L814 Other melanin hyperpigmentation: Secondary | ICD-10-CM | POA: Diagnosis not present

## 2023-03-08 DIAGNOSIS — L578 Other skin changes due to chronic exposure to nonionizing radiation: Secondary | ICD-10-CM | POA: Diagnosis not present

## 2023-03-08 DIAGNOSIS — L821 Other seborrheic keratosis: Secondary | ICD-10-CM | POA: Diagnosis not present

## 2023-03-17 DIAGNOSIS — K635 Polyp of colon: Secondary | ICD-10-CM | POA: Diagnosis not present

## 2023-03-17 DIAGNOSIS — K3189 Other diseases of stomach and duodenum: Secondary | ICD-10-CM | POA: Diagnosis not present

## 2023-03-17 DIAGNOSIS — K449 Diaphragmatic hernia without obstruction or gangrene: Secondary | ICD-10-CM | POA: Diagnosis not present

## 2023-03-17 DIAGNOSIS — Z860100 Personal history of colon polyps, unspecified: Secondary | ICD-10-CM | POA: Diagnosis not present

## 2023-03-17 DIAGNOSIS — Z09 Encounter for follow-up examination after completed treatment for conditions other than malignant neoplasm: Secondary | ICD-10-CM | POA: Diagnosis not present

## 2023-03-17 DIAGNOSIS — Z800 Family history of malignant neoplasm of digestive organs: Secondary | ICD-10-CM | POA: Diagnosis not present

## 2023-03-17 DIAGNOSIS — R1013 Epigastric pain: Secondary | ICD-10-CM | POA: Diagnosis not present

## 2023-03-17 DIAGNOSIS — D124 Benign neoplasm of descending colon: Secondary | ICD-10-CM | POA: Diagnosis not present

## 2023-03-17 DIAGNOSIS — K5730 Diverticulosis of large intestine without perforation or abscess without bleeding: Secondary | ICD-10-CM | POA: Diagnosis not present

## 2023-03-30 DIAGNOSIS — K080 Exfoliation of teeth due to systemic causes: Secondary | ICD-10-CM | POA: Diagnosis not present

## 2023-06-04 DIAGNOSIS — H524 Presbyopia: Secondary | ICD-10-CM | POA: Diagnosis not present

## 2023-06-22 DIAGNOSIS — N401 Enlarged prostate with lower urinary tract symptoms: Secondary | ICD-10-CM | POA: Diagnosis not present

## 2023-06-22 DIAGNOSIS — Z8546 Personal history of malignant neoplasm of prostate: Secondary | ICD-10-CM | POA: Diagnosis not present

## 2023-06-22 DIAGNOSIS — N528 Other male erectile dysfunction: Secondary | ICD-10-CM | POA: Diagnosis not present

## 2023-06-22 DIAGNOSIS — C61 Malignant neoplasm of prostate: Secondary | ICD-10-CM | POA: Diagnosis not present

## 2023-06-22 DIAGNOSIS — R351 Nocturia: Secondary | ICD-10-CM | POA: Diagnosis not present

## 2023-06-24 DIAGNOSIS — E039 Hypothyroidism, unspecified: Secondary | ICD-10-CM | POA: Diagnosis not present

## 2023-06-24 DIAGNOSIS — I10 Essential (primary) hypertension: Secondary | ICD-10-CM | POA: Diagnosis not present

## 2023-06-24 DIAGNOSIS — Z79899 Other long term (current) drug therapy: Secondary | ICD-10-CM | POA: Diagnosis not present

## 2023-06-24 DIAGNOSIS — Z0000 Encounter for general adult medical examination without abnormal findings: Secondary | ICD-10-CM | POA: Diagnosis not present

## 2023-06-24 DIAGNOSIS — R7303 Prediabetes: Secondary | ICD-10-CM | POA: Diagnosis not present

## 2023-06-24 DIAGNOSIS — E7800 Pure hypercholesterolemia, unspecified: Secondary | ICD-10-CM | POA: Diagnosis not present

## 2023-06-24 DIAGNOSIS — Z1159 Encounter for screening for other viral diseases: Secondary | ICD-10-CM | POA: Diagnosis not present

## 2023-08-10 DIAGNOSIS — E039 Hypothyroidism, unspecified: Secondary | ICD-10-CM | POA: Diagnosis not present

## 2023-09-29 DIAGNOSIS — C61 Malignant neoplasm of prostate: Secondary | ICD-10-CM | POA: Diagnosis not present

## 2023-10-14 DIAGNOSIS — J3089 Other allergic rhinitis: Secondary | ICD-10-CM | POA: Diagnosis not present

## 2023-10-14 DIAGNOSIS — J301 Allergic rhinitis due to pollen: Secondary | ICD-10-CM | POA: Diagnosis not present

## 2023-10-14 DIAGNOSIS — H1045 Other chronic allergic conjunctivitis: Secondary | ICD-10-CM | POA: Diagnosis not present

## 2023-10-21 DIAGNOSIS — K080 Exfoliation of teeth due to systemic causes: Secondary | ICD-10-CM | POA: Diagnosis not present

## 2023-12-20 DIAGNOSIS — K080 Exfoliation of teeth due to systemic causes: Secondary | ICD-10-CM | POA: Diagnosis not present
